# Patient Record
Sex: Male | Born: 1970 | Race: White | Hispanic: No | Marital: Single | Smoking: Never smoker
Health system: Southern US, Community
[De-identification: ages and names within clinical notes are randomized; demographics above are authoritative.]

## PROBLEM LIST (undated history)

## (undated) DIAGNOSIS — I10 Essential (primary) hypertension: Secondary | ICD-10-CM

## (undated) DIAGNOSIS — K219 Gastro-esophageal reflux disease without esophagitis: Secondary | ICD-10-CM

## (undated) HISTORY — PX: EYE SURGERY: SHX253

---

## 2007-03-01 ENCOUNTER — Inpatient Hospital Stay (HOSPITAL_COMMUNITY): Admission: EM | Admit: 2007-03-01 | Discharge: 2007-03-02 | Payer: Self-pay | Admitting: Emergency Medicine

## 2007-03-01 ENCOUNTER — Encounter: Payer: Self-pay | Admitting: Cardiology

## 2007-03-01 ENCOUNTER — Ambulatory Visit: Payer: Self-pay | Admitting: Cardiology

## 2007-03-01 ENCOUNTER — Ambulatory Visit: Payer: Self-pay | Admitting: Cardiovascular Disease

## 2007-03-21 ENCOUNTER — Ambulatory Visit: Payer: Self-pay | Admitting: Cardiovascular Disease

## 2007-03-27 ENCOUNTER — Ambulatory Visit: Payer: Self-pay | Admitting: Cardiovascular Disease

## 2007-06-23 ENCOUNTER — Ambulatory Visit: Payer: Self-pay | Admitting: Cardiovascular Disease

## 2007-09-22 ENCOUNTER — Ambulatory Visit: Payer: Self-pay | Admitting: Cardiovascular Disease

## 2008-01-02 ENCOUNTER — Ambulatory Visit: Payer: Self-pay | Admitting: Cardiovascular Disease

## 2008-03-29 ENCOUNTER — Ambulatory Visit: Payer: Self-pay | Admitting: Cardiovascular Disease

## 2008-06-21 ENCOUNTER — Ambulatory Visit: Payer: Self-pay | Admitting: Cardiovascular Disease

## 2008-09-20 ENCOUNTER — Ambulatory Visit: Payer: Self-pay | Admitting: Cardiovascular Disease

## 2008-12-20 ENCOUNTER — Ambulatory Visit: Payer: Self-pay | Admitting: Cardiovascular Disease

## 2008-12-20 ENCOUNTER — Encounter (INDEPENDENT_AMBULATORY_CARE_PROVIDER_SITE_OTHER): Payer: Self-pay | Admitting: *Deleted

## 2009-03-14 ENCOUNTER — Encounter (INDEPENDENT_AMBULATORY_CARE_PROVIDER_SITE_OTHER): Payer: Self-pay | Admitting: *Deleted

## 2009-03-14 ENCOUNTER — Ambulatory Visit: Payer: Self-pay | Admitting: Cardiology

## 2009-03-14 ENCOUNTER — Ambulatory Visit: Payer: Self-pay | Admitting: Cardiovascular Disease

## 2009-03-14 DIAGNOSIS — E785 Hyperlipidemia, unspecified: Secondary | ICD-10-CM | POA: Insufficient documentation

## 2009-03-14 DIAGNOSIS — I2510 Atherosclerotic heart disease of native coronary artery without angina pectoris: Secondary | ICD-10-CM

## 2009-03-14 LAB — CONVERTED CEMR LAB
BUN: 11 mg/dL (ref 6–23)
Basophils Relative: 0.6 % (ref 0.0–3.0)
CO2: 28 meq/L (ref 19–32)
Calcium: 9.6 mg/dL (ref 8.4–10.5)
Eosinophils Absolute: 0.2 10*3/uL (ref 0.0–0.7)
Eosinophils Relative: 3 % (ref 0.0–5.0)
GFR calc non Af Amer: 113.75 mL/min (ref >60)
Glucose, Bld: 82 mg/dL (ref 70–99)
INR: 1 (ref 0.8–1.0)
Lymphocytes Relative: 41.6 % (ref 12.0–46.0)
Lymphs Abs: 2.1 10*3/uL (ref 0.7–4.0)
Monocytes Relative: 9.5 % (ref 3.0–12.0)
Neutro Abs: 2.3 10*3/uL (ref 1.4–7.7)
Neutrophils Relative %: 45.3 % (ref 43.0–77.0)
Platelets: 203 10*3/uL (ref 150.0–400.0)
Prothrombin Time: 10.3 s (ref 9.1–11.7)
Sodium: 138 meq/L (ref 135–145)
WBC: 5.1 10*3/uL (ref 4.5–10.5)
aPTT: 27.3 s (ref 21.7–28.8)

## 2009-03-21 ENCOUNTER — Ambulatory Visit: Payer: Self-pay | Admitting: Cardiology

## 2009-03-21 ENCOUNTER — Inpatient Hospital Stay (HOSPITAL_BASED_OUTPATIENT_CLINIC_OR_DEPARTMENT_OTHER): Admission: RE | Admit: 2009-03-21 | Discharge: 2009-03-21 | Payer: Self-pay | Admitting: Cardiology

## 2009-03-21 ENCOUNTER — Ambulatory Visit: Payer: Self-pay | Admitting: Cardiovascular Disease

## 2009-03-28 ENCOUNTER — Ambulatory Visit: Payer: Self-pay | Admitting: Cardiovascular Disease

## 2009-04-18 ENCOUNTER — Ambulatory Visit: Payer: Self-pay | Admitting: Cardiology

## 2009-04-18 LAB — CONVERTED CEMR LAB
ALT: 38 units/L (ref 0–53)
AST: 28 units/L (ref 0–37)
Albumin: 4.1 g/dL (ref 3.5–5.2)
Alkaline Phosphatase: 61 units/L (ref 39–117)
Cholesterol: 134 mg/dL (ref 0–200)
VLDL: 14 mg/dL (ref 0.0–40.0)

## 2009-04-22 ENCOUNTER — Ambulatory Visit: Payer: Self-pay | Admitting: Cardiology

## 2010-03-10 NOTE — Miscellaneous (Signed)
Summary: Orders Update  Clinical Lists Changes  Orders: Added new Test order of T-2 View CXR (71020TC) - Signed 

## 2010-03-10 NOTE — Assessment & Plan Note (Signed)
Summary: research pt /pre cath saturn   Visit Type:  Follow-up Primary Provider:  [facility]  CC:  Research Pt. - Pre-cath.  History of Present Illness: The patient is 40 years old and came in for a pre-catheterization evaluation prior to his scheduled procedure. 2 years ago he was admitted with chest pain and underwent catheterization and was found to have nonobstructive coronary disease. At that time he was enrolled in a Saturn trial and underwent intravascular ultrasound of the circumflex artery. He has done well since that time and is now brought back for followup to your catheterization.  He's had no chest pain short of breath or palpitations. He has not been on aspirin because someone told him it might interfere with his kidney function.  His primary care physician is [facility].  He is currently working in a grocery store.  Current Medications (verified): 1)  Saturn Study Drugs .... Take As Directed  Allergies (verified): No Known Drug Allergies  Past History:  Past Medical History: Nonobstructive CAD Hyperlipidemia  Review of Systems       ROS is negative except as outlined in HPI.   Vital Signs:  Patient profile:   40 year old male Height:      72 inches Weight:      224.50 pounds BMI:     30.56 Pulse rate:   52 / minute Pulse rhythm:   irregular Resp:     18 per minute BP sitting:   124 / 100  (left arm) Cuff size:   large  Vitals Entered By: Vikki Ports (March 14, 2009 11:49 AM)  Physical Exam  Additional Exam:  Gen. Well-nourished, in no distress   Neck: No JVD, thyroid not enlarged, no carotid bruits Lungs: No tachypnea, clear without rales, rhonchi or wheezes Cardiovascular: Rhythm regular, PMI not displaced,  heart sounds  normal, no murmurs or gallops, no peripheral edema, pulses normal in all 4 extremities. Abdomen: BS normal, abdomen soft and non-tender without masses or organomegaly, no  hepatosplenomegaly. MS: No deformities, no cyanosis or clubbing   Neuro:  No focal sns   Skin:  no lesions  He had cyanosis of the toes on both feet. This appeared to be more prominent when the feet were dependent. He had good pedal pulses. There were no petechiae.   Impression & Recommendations:  Problem # 1:  CORONARY ATHEROSCLEROSIS NATIVE CORONARY ARTERY (ICD-414.01) He had nonobstructive CAD at catheterization 2 years ago. He has had no symptoms. He is scheduled for a followup catheterization and intravascular ultrasound study in one week.  He also has some dependent cyanosis. He has good peripheral pulses. This may be related to small vessel disease. His updated medication list for this problem includes:    Aspirin 81 Mg Tbec (Aspirin) .Marland Kitchen... Take one tablet by mouth daily  Orders: TLB-BMP (Basic Metabolic Panel-BMET) (80048-METABOL) TLB-CBC Platelet - w/Differential (85025-CBCD) TLB-PTT (85730-PTTL) TLB-PT (Protime) (85610-PTP) T-2 View CXR (71020TC) Cardiac Catheterization (Cardiac Cath)  Problem # 2:  HYPERLIPIDEMIA-MIXED (ICD-272.4) He has hyperlipidemia and is in the [place] trial where he is getting either Lipitor 80 mg or Crestor 40 mg.  Patient Instructions: 1)  Your physician has recommended you make the following change in your medication: 1) Start aspirin 81mg  once daily  2)  Your physician has requested that you have a cardiac catheterization (per research study).Cardiac catheterization is used to diagnose and/or treat various heart conditions. Doctors may recommend this procedure for a number of different reasons. The  most common reason is to evaluate chest pain. Chest pain can be a symptom of coronary artery disease (CAD), and cardiac catheterization can show whether plaque is narrowing or blocking your heart's arteries. This procedure is also used to evaluate the valves, as well as measure the blood flow and oxygen levels in different parts of your heart.  For further  information please visit [email or web address]  Please follow instruction sheet, as given.  Appended Document: research pt /pre cath saturn Per Dr. Juanda Chance- at the end of the pt's research study, he should start Crestor 20mg  once daily. He should have a lipid and liver profile about 3 1/2 weeks after starting Crestor (we will give samples & send to research for them to give to the pt.)

## 2010-03-10 NOTE — Assessment & Plan Note (Signed)
Summary: EPH/JML   Primary Provider:  [facility]  CC:  cardiac catheterization follow up.  Pt states no complaints at this time.  Marland Kitchen  History of Present Illness: Patient is 40 years old and returns for a followup visit after his recent catheterization procedure. 2 years ago he was admitted to the hospital with chest pain and underwent catheterization and was found to have nonobstructive CAD. He was enrolled in a Saturn trial and underwent intravascular ultrasound. Recently had followup catheterization which showed no progression of disease with nonobstructive plaque. He has good LV function.  He is had no symptoms of chest pain shortness of breath or palpitations.  His other problem is hyperlipidemia. He is now off study drug and is taking sample Crestor tablets.  Current Medications (verified): 1)  Aspirin 81 Mg Tbec (Aspirin) .... Take One Tablet By Mouth Daily 2)  Crestor 20 Mg Tabs (Rosuvastatin Calcium) .... Take One Tablet Every Evening  Allergies (verified): No Known Drug Allergies  Past History:  Past Medical History: Reviewed history from 03/14/2009 and no changes required. Nonobstructive CAD Hyperlipidemia  Review of Systems       ROS is negative except as outlined in HPI.   Vital Signs:  Patient profile:   40 year old male Height:      72 inches Weight:      228 pounds BMI:     31.03 Pulse rate:   61 / minute Pulse rhythm:   regular BP sitting:   138 / 90  (left arm) Cuff size:   large  Vitals Entered By: Judithe Modest CMA (April 22, 2009 4:33 PM)  Physical Exam  Additional Exam:  Gen. Well-nourished, in no distress   Neck: No JVD, thyroid not enlarged, no carotid bruits Lungs: No tachypnea, clear without rales, rhonchi or wheezes Cardiovascular: Rhythm regular, PMI not displaced,  heart sounds  normal, no murmurs or gallops, no peripheral edema, pulses normal in all 4 extremities. Abdomen: BS normal, abdomen soft and non-tender without  masses or organomegaly, no hepatosplenomegaly. MS: No deformities, no cyanosis or clubbing   Neuro:  No focal sns   Skin:  no lesions  The right femoral artery site was well-healed   Impression & Recommendations:  Problem # 1:  CORONARY ATHEROSCLEROSIS NATIVE CORONARY ARTERY (ICD-414.01) He has nonobstructive CAD at follow up catheterization. It looks like there has been little or no progression over the past 2 years. We will continue secondary risk factor modification. His updated medication list for this problem includes:    Aspirin 81 Mg Tbec (Aspirin) .Marland Kitchen... Take one tablet by mouth daily  Problem # 2:  HYPERLIPIDEMIA-MIXED (ICD-272.4) He has hyperlipidemia and is currently on Crestor samples. Finances are an issue and I think the most economical way for him to get the cholesterol lowering would be with the Lipitor program we can get a discount from his co-pay. We plan to start him on 40 mg of Lipitor we'll get a followup lipid and liver panel in 6 weeks. He will followup with his primary care physician and we will arrange for cardiology follow up appointment in one year with Dr. Shirlee Latch. His updated medication list for this problem includes:    Lipitor 40 Mg Tabs (Atorvastatin calcium) .Marland Kitchen... Take one tablet by mouth daily.  Patient Instructions: 1)  Your physician has recommended you make the following change in your medication: 1) Start lipitor 40mg  once daily  2)  You will need FASTING labs in 6 weeks after starting lipitor-  this will be due around 05/26/09. 3)  Your physician wants you to follow-up in: 1 year with Dr. Shirlee Latch.  You will receive a reminder letter in the mail two months in advance. If you don't receive a letter, please call our office to schedule the follow-up appointment. Prescriptions: LIPITOR 40 MG TABS (ATORVASTATIN CALCIUM) Take one tablet by mouth daily.  #30 x 6   Entered by:   Sherri Rad, RN, BSN   Authorized by:   Lenoria Farrier, MD, [facility]   Signed by:    Sherri Rad, RN, BSN on 04/22/2009   Method used:   Electronically to        [facility] [phone]* (retail)       [address]       [place], [place]  [address]       Ph: [phone] or [phone]       Fax: [phone]   RxID:   [id number]

## 2010-03-10 NOTE — Cardiovascular Report (Signed)
Summary: Pre Cath Orders  Pre Cath Orders   Imported By: Roderic Ovens 03/19/2009 12:27:20  _____________________________________________________________________  External Attachment:    Type:   Image     Comment:   External Document

## 2010-03-10 NOTE — Letter (Signed)
Summary: Cardiac Catheterization Instructions- [facility]  [facility], Main Office  [address]   [place], [place] [address]   Phone: [phone]  Fax: [phone]     03/14/2009 MRN: [id number]  Rodney Lozano [address] [place], [place]  [address]  Dear [name],   You are scheduled for a Cardiac Catheterization on Friday 03/21/09 with Dr.Brodie  Please arrive to the 1st floor of the Heart and Vascular Center at [facility] at 7:30 am on the day of your procedure. Please do not arrive before 6:30 a.m. Call the Heart and Vascular Center at [phone] if you are unable to make your appointmnet. The Code to get into the parking garage under the building is 0900. Take the elevators to the 1st floor. You must have someone to drive you home. Someone must be with you for the first 24 hours after you arrive home. Please wear clothes that are easy to get on and off and wear slip-on shoes. Do not eat or drink after midnight except water with your medications that morning. Bring all your medications and current insurance cards with you.  ___ DO NOT take these medications before your procedure: ________________________________________________________________  _x__ Make sure you take your aspirin.  _x__ You may take ALL of your medications with water that morning. ________________________________________________________________________________________________________________________________  ___ DO NOT take ANY medications before your procedure.  ___ Pre-med instructions:  ________________________________________________________________________________________________________________________________  The usual length of stay after your procedure is 2 to 3 hours. This can vary.  If you have any questions, please call the office at the number listed above.   Sherri Rad, RN, BSN

## 2010-06-23 NOTE — H&P (Signed)
NAME:  Rodney Lozano, Rodney Lozano NO.:  [id number]   MEDICAL RECORD NO.:  [id number]          PATIENT TYPE:  EMS   LOCATION:  MAJO                         FACILITY:  [facility]   PHYSICIAN:  Vernice Jefferson, MD          DATE OF BIRTH:  Jun 26, 1970   DATE OF ADMISSION:  03/01/2007  DATE OF DISCHARGE:                              HISTORY & PHYSICAL   CHIEF COMPLAINT:  Shortness of breath.   HISTORY OF PRESENT ILLNESS:  The patient is a 40 year old white male  with no past medical history who comes in with complaints of shortness  of breath that began approximately 12 o'clock this evening also  associated with some fluttering in his chest.  The patient reports  that he has not throughout the course of this had any chest pain.  He  has shortness of breath.  It got somewhat better in the EMS vehicle with  nitroglycerin.  Prior to this episode this evening, the patient has been  in very good health.  He has had no complaints.  He has occasional right  arm numbness and tingling and right leg numbness but it has not been  exertional related.  He has never had any chest pain or pressure  associated with exertion.  There is no syncope, no presyncope.  Currently the patient is feeling mildly short of breath.  He denies any  current chest pain or pressure.  The patient also denies any increasing  lower extremity edema, no increased weight gain over the past week.   PAST MEDICAL HISTORY:  None.   SOCIAL HISTORY:  Lives in [place] with mother.  No tobacco.  Weekend  drinking only.  No drug use.   FAMILY HISTORY:  Positive for early coronary artery disease with  grandfather with MI in the 48s and father has had a CABG by age 40.   ALLERGIES:  No known drug allergies.   MEDICATIONS:  None.  The patient reports he does not take any herbal  medications either.   REVIEW OF SYSTEMS:  Negative 11-point review of systems except for what  was dictated in the above HPI.   PHYSICAL EXAMINATION:   VITAL SIGNS:  Blood pressure 139/86, heart rate  112, respirations 12, T-max is afebrile.  GENERAL:  Well-developed, well-nourished white male in no acute  distress.  HEENT:  Moist mucous membranes. Sclerae anicteric. No pallor.  NECK:  Supple.  No jugular venous distention.  CARDIOVASCULAR:  Regular rate and rhythm without murmurs, rubs, or  gallops.  CHEST:  Clear to auscultation bilaterally.  No wheezes, rales or  rhonchi.  ABDOMEN:  Soft, nontender, nondistended.  Normal bowel sounds.  EXTREMITIES:  No peripheral edema.  Pulses 2+ bilaterally.  NEUROLOGIC:  Nonfocal.   MEDICAL DECISION MAKING:  Chest x-ray demonstrates no volume overload,  no infiltrates.  EKG personally interpreted by me demonstrates normal  sinus rhythm with left atrial enlargement, frequent PVCs but no acute ST-  T wave abnormalities.   LABORATORY DATA:  Significant for a white count of 7.3, hemoglobin 15,1,  platelets 286.  BUN and creatinine of 16 and  0.9, glucose 124, D-dimer  negative.  BNP 50 which is negative.  Cardiac biomarkers are negative.   IMPRESSION:  1. Acute dyspnea.  Possible acute coronary syndrome with unstable      angina.  2. Frequent ventricular ectopy.   PLAN:  Given his above D-dimer and BNP are negative and EKG not  suggestive of acute event, as well as there is no evidence of  significant volume overload, I doubt that this shortness of breath is  related to congestive heart failure and less likely it is related to an  acute coronary syndrome.  However, since he does have an early family  history and was hypertensive coming into the emergency department, we  think he probably needs to come in for serial cardiac biomarkers and  possible risk stratification.  His ectopy could be related to scar  related focus versus active ischemia.  Given his low TIMI risk score  however and lack of chest pain, I do not think heparin is indicated at  this time.  He has gotten a full dose of aspirin  and we will follow  these biomarkers.  A 2-D echocardiogram in the morning to evaluate for  any structural heart disease.      Vernice Jefferson, MD  Electronically Signed     JT/MEDQ  D:  03/01/2007  T:  03/01/2007  Job:  [id number]

## 2010-06-23 NOTE — Cardiovascular Report (Signed)
NAME:  Rodney Lozano, Rodney Lozano NO.:  [id number]   MEDICAL RECORD NO.:  [id number]          PATIENT TYPE:  INP   LOCATION:  3730                         FACILITY:  [facility]   PHYSICIAN:  Everardo Beals. Juanda Chance, MD, FACCDATE OF BIRTH:  [birth year]   DATE OF PROCEDURE:  03/01/2007  DATE OF DISCHARGE:  03/02/2007                            CARDIAC CATHETERIZATION   PAST MEDICAL HISTORY:  [name] is 40 years old and was admitted  with the shortness of breath, thought to represent an acute coronary  syndrome.   PROCEDURE:  The procedure was performed at the right femoral artery,  arterial sheath, with 6-French pyriform coronary catheters.  A front  wall arterial puncture was performed, and Omnipaque contrast was used.   The patient was enrolled in the SATURN trial and an IVUS was performed  as part of this trial.  This was performed at the circumflex artery.  The patient was given weight-adjusted heparin upon ACT greater than 200  seconds prior to this.  He tolerated the procedure well and left the  laboratory in satisfactory condition.   RESULTS:  The left main coronary artery.  The left main coronary artery  was found to be free of significant disease.   Left anterior descending artery.  Left anterior descending artery gave  rise to 2 diagonal branches and several septal perforators.  There was  30% proximal and 20% mid stenosis in the LAD.   The circumflex artery.  The circumflex artery gave rise to an atrial  branch, a marginal branch, and 2 posterolateral branches.  There was 30%  proximal, 40% mid, and 30% distal stenosis in the circumflex artery.   The right coronary artery.  The right coronary artery is a moderate-  sized vessel and gave rise to conus branch, 2 right ventricular  branches, and posterior descending branch, and 2 posterolateral  branches.  There was 30% narrowing in the mid right coronary artery.   The left ventriculogram.  The left ventriculogram  performed in the RAO  projection showed good wall motion and no areas of hypokinesis.  The  estimated ejection fraction was 60%.   The distal aortogram.  The distal aortogram was performed which showed  patent renal arteries and no significant aortoiliac obstruction.   Intravascular ultrasound showed a moderate of amount of plaque which was  nonobstructive.   CONCLUSION:  1. Nonobstructive coronary artery disease with 30% proximal and 20%      mid stenosis in the left anterior descending artery; 30% proximal,      40% mid, and 30% distal stenosis in the circumflex artery; 30%      narrowing in the right coronary, and normal LV function.  2. Intravascular ultrasound of the circumflex artery as part of the      SATURN.   DISPOSITION:  The patient will return to post angio room for further  observation.  If the patient qualifies for SATURN, we will obtain a  followup cath in 2 years.  We will plan an intensive check and risk  factor modification.  SATURN randomized in the patient to high dose  Crestor versus 80  mg of atorvastatin.      Bruce Elvera Lennox Juanda Chance, MD, [facility]  Electronically Signed     BRB/MEDQ  D:  07/03/2007  T:  07/03/2007  Job:  [id number]

## 2010-06-26 NOTE — Discharge Summary (Signed)
NAME:  Rodney Lozano, Rodney Lozano NO.:  [id number]   MEDICAL RECORD NO.:  [id number]          PATIENT TYPE:  INP   LOCATION:  3730                         FACILITY:  [facility]   PHYSICIAN:  Noralyn Pick. Eden Emms, MD, FACCDATE OF BIRTH:  [birth year]   DATE OF ADMISSION:  03/01/2007  DATE OF DISCHARGE:  03/02/2007                         DISCHARGE SUMMARY - REFERRING   Please note that the official History and Physical is not on the chart.  According to the paperwork available that is in the chart, Rodney Lozano  is a 40 year old male who presented with shortness of breath that began  at midnight associated with palpitations.  He denied chest discomfort or  syncope.   LABORATORY DATA:  Urine drug screen was unremarkable.  TSH 2.372.  Fasting lipids showed a total cholesterol 239, triglycerides 56, HDL 35,  LDL 193.  CK, MB, relative indexes, and troponins were within normal  limits x2.  BNP was 50. Hemoglobin A1c 5.5.  Admission hemoglobin 15.1,  hematocrit 44.0, normal indices, platelets 286, WBC 7.3. D-dimer 0.33.  Sodium 138, potassium 3.9, BUN 12, creatinine 0.91, glucose 90.   Chest x-ray showed low-volume chest film with vascular crowding and  bibasilar atelectasis.   EKG showed sinus bradycardia, left axis deviation, early R wave,  probable LVH.   HOSPITAL COURSE:  The patient was admitted to the host hospital and  underwent cardiac catheterization on January 21 by Dr. Juanda Chance.  According to his note, he had an EF of 60% three-vessel nonobstructive  coronary artery disease. Dr. Juanda Chance felt that his discomfort with  shortness of breath was not related to cardiac wise; however, he should  be treated for cardiac risk factor prevention.  It was noted that he was  hypertensive on presentation to the emergency room. Overnight he  continued to have symptoms. Dr. Riley Kill was not clear of the etiology.  Echocardiogram was performed by Teodoro Spray. Report is not available to  me at the  time of this dictation.  Dr. Eden Emms had an MRI performed on  the heart, and this showed EF of 52% with abnormal septal motion, no  hyper-enhancement or evidence of myocarditis, infiltration, or scar.  After review, Dr. Eden Emms felt that the patient could be discharged home.  The patient was placed into the SATURN trial by research   DISCHARGE DIAGNOSES:  1. Shortness of breath and palpitations of uncertain etiology.  There      was not any evidence of dysrhythmia based on telemetry in the      hospital.  2. Nonobstructive coronary artery disease .  3. Hypertension .  4. Hyperlipidemia.   PROCEDURES PERFORMED:  Cardiac catheterization by Dr. Juanda Chance on March 01, 2007.   DISPOSITION:  The patient is discharged home on March 02, 2007.  Wound  care and activities were per supplemental sheet.  He received new  medications including:  1. Aspirin 325 mg daily.  2. Lopressor 45 mg b.i.d.  3. Lipitor 40 mg nightly.   He was asked to obtain a primary care physician. The office will call  him with event monitor placement.  He will need blood work  in 6-8 weeks  in regards to FLP and LFTs.  He was asked to bring medications to all  followup appointments with Dr. Juanda Chance which was scheduled for April 14, 2007, at 3:00 p.m.      Joellyn Rued, PA-C      Noralyn Pick. Eden Emms, MD, [facility]  Electronically Signed    EW/MEDQ  D:  05/03/2007  T:  05/03/2007  Job:  [id number]   cc:   Everardo Beals. Juanda Chance, MD, [facility]

## 2010-10-30 LAB — LIPID PANEL
Cholesterol: 239 — ABNORMAL HIGH
HDL: 35 — ABNORMAL LOW
Triglycerides: 56

## 2010-10-30 LAB — TROPONIN I: Troponin I: 0.01

## 2010-10-30 LAB — BASIC METABOLIC PANEL
BUN: 12
CO2: 26
Calcium: 9.3
Chloride: 105
Creatinine, Ser: 0.91
GFR calc Af Amer: >60

## 2010-10-30 LAB — DIFFERENTIAL
Basophils Absolute: 0.1
Lymphocytes Relative: 38
Neutro Abs: 3.3
Neutrophils Relative %: 46

## 2010-10-30 LAB — RAPID URINE DRUG SCREEN, HOSP PERFORMED
Amphetamines: NOT DETECTED
Benzodiazepines: NOT DETECTED
Opiates: NOT DETECTED
Tetrahydrocannabinol: NOT DETECTED

## 2010-10-30 LAB — POCT CARDIAC MARKERS
CKMB, poc: 1.1
Troponin i, poc: <0.05

## 2010-10-30 LAB — CBC
Hemoglobin: 15.1
MCHC: 34.5
MCV: 90.5
Platelets: 263
Platelets: 286
RBC: 4.63
RDW: 13.1
RDW: 13.8

## 2010-10-30 LAB — I-STAT 8, (EC8 V) (CONVERTED LAB)
Acid-base deficit: 1
TCO2: 26
pCO2, Ven: 42.1 — ABNORMAL LOW
pH, Ven: 7.373 — ABNORMAL HIGH

## 2010-10-30 LAB — CK TOTAL AND CKMB (NOT AT ARMC)
CK, MB: 1.6
Relative Index: INVALID

## 2010-10-30 LAB — POCT I-STAT CREATININE
Creatinine, Ser: 0.9
Operator id: 198171

## 2010-10-30 LAB — HEMOGLOBIN A1C: Mean Plasma Glucose: 119

## 2010-10-30 LAB — B-NATRIURETIC PEPTIDE (CONVERTED LAB): Pro B Natriuretic peptide (BNP): 50

## 2010-10-30 LAB — D-DIMER, QUANTITATIVE: D-Dimer, Quant: 0.33

## 2010-10-30 LAB — CARDIAC PANEL(CRET KIN+CKTOT+MB+TROPI): CK, MB: 1.4

## 2010-10-30 LAB — TSH: TSH: 2.372

## 2012-01-12 IMAGING — CR DG CHEST 2V
2 series · 2 of 2 positions shown · non-contrast
Comparison: 03/01/2007

CLINICAL DATA: Pre catheterization evaluation.

CHEST - 2 VIEW

[view not recorded (1 of 2)]
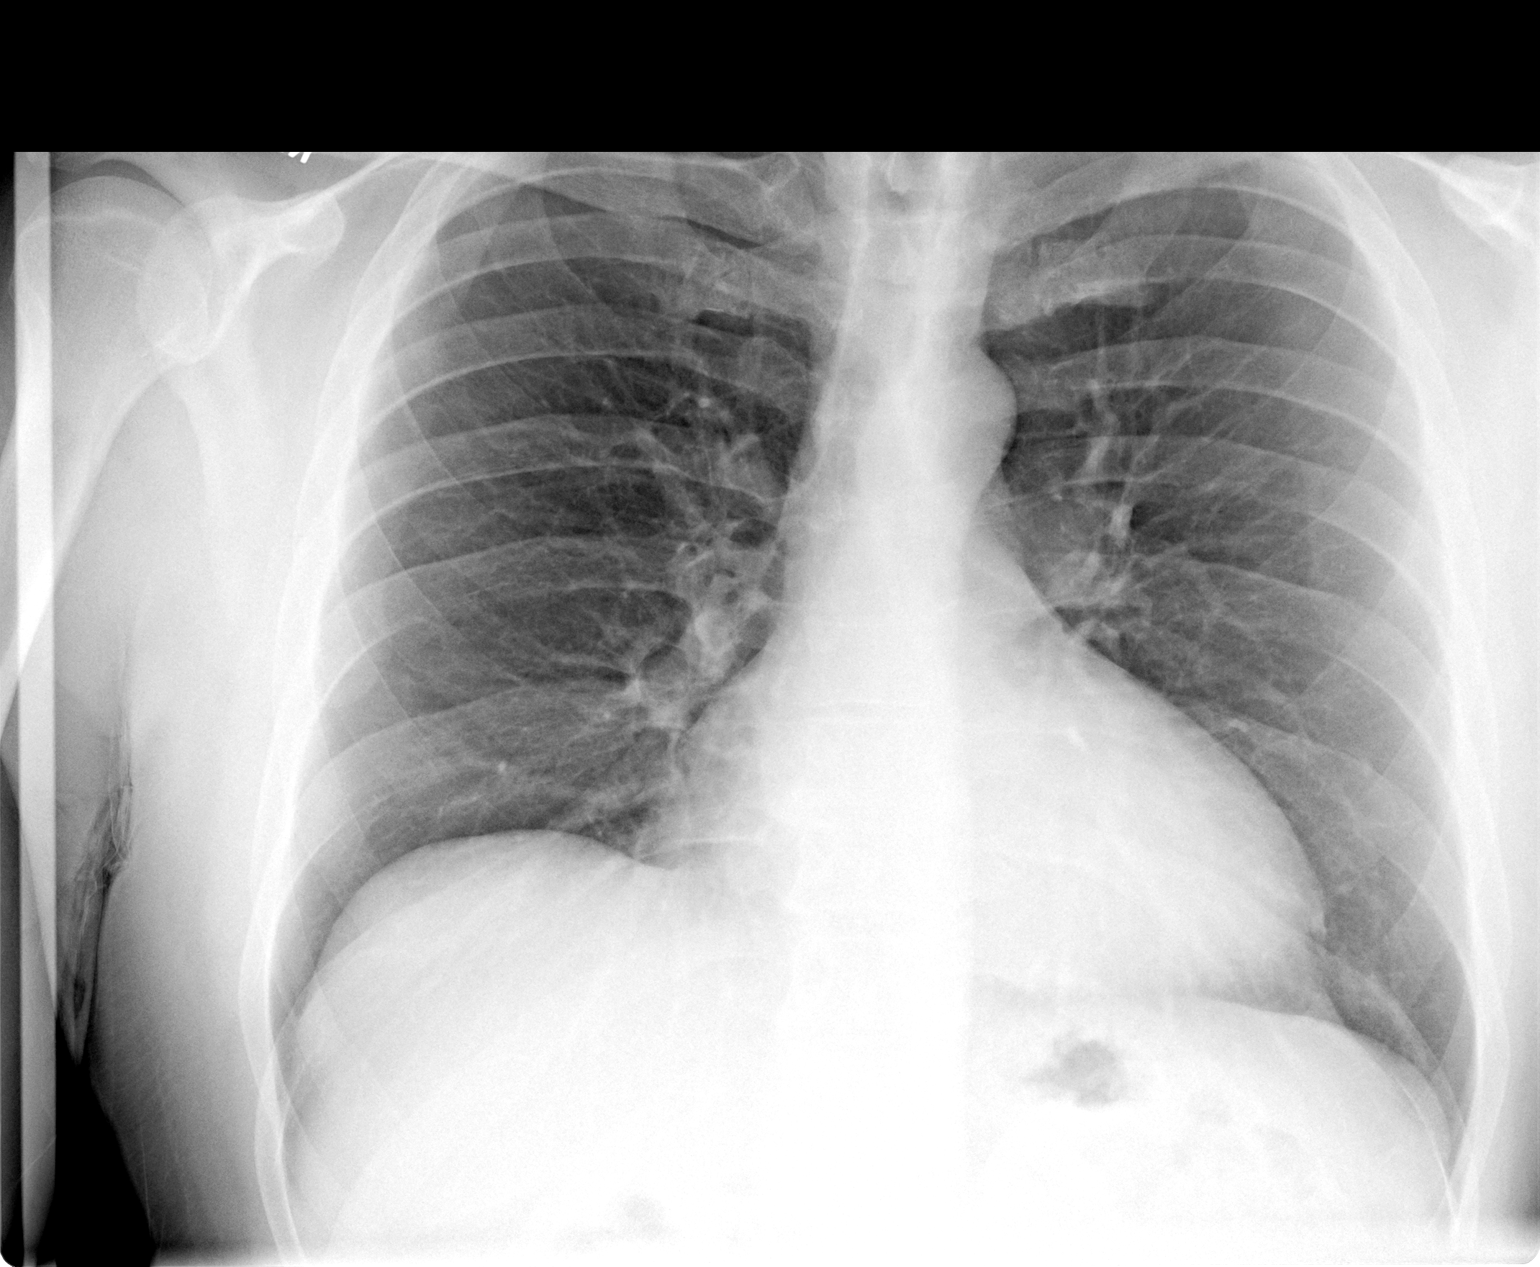

[view not recorded (2 of 2)]
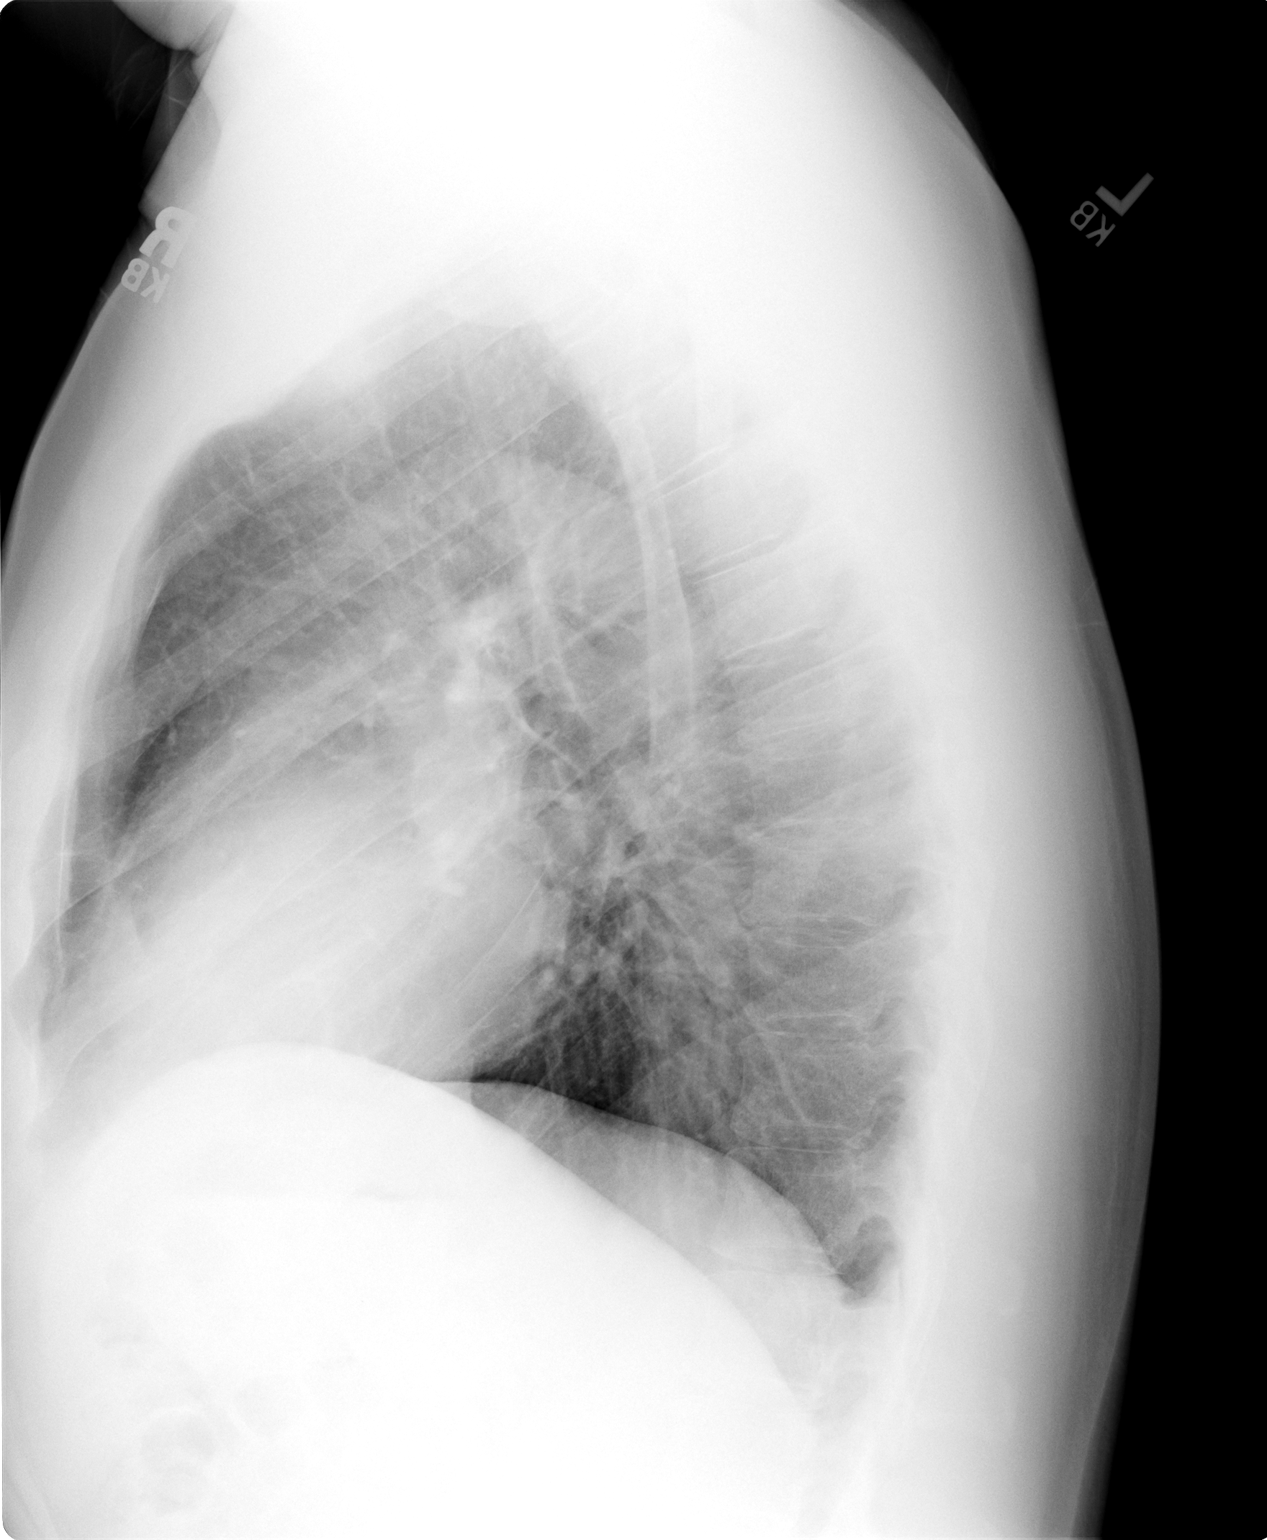

[2 of 2 positions shown; findings below may reference images not displayed]

FINDINGS: Low lung volumes are present and taking this into
consideration heart and mediastinal contours are within normal
limits.

There is some crowding of bronchovascular markings at both bases
due to the low lung volumes.  No focal infiltrates or signs of
congestive failure are noted.  No pleural fluid is seen.  No
peribronchial cuffing is seen.  Bony structures are intact.
IMPRESSION: Stable cardiopulmonary appearance with no acute abnormality noted.

## 2018-09-06 ENCOUNTER — Ambulatory Visit (HOSPITAL_COMMUNITY)
Admission: AD | Admit: 2018-09-06 | Discharge: 2018-09-06 | Disposition: A | Discharge status: Home / Self Care | Payer: BC Managed Care – PPO | Source: Ambulatory Visit | Attending: Ophthalmology | Admitting: Ophthalmology

## 2018-09-06 ENCOUNTER — Ambulatory Visit (HOSPITAL_COMMUNITY): Payer: BC Managed Care – PPO | Admitting: Certified Registered Nurse Anesthetist

## 2018-09-06 ENCOUNTER — Other Ambulatory Visit: Payer: Self-pay | Admitting: Ophthalmology

## 2018-09-06 ENCOUNTER — Encounter (HOSPITAL_COMMUNITY): Payer: Self-pay | Admitting: *Deleted

## 2018-09-06 ENCOUNTER — Other Ambulatory Visit: Payer: Self-pay

## 2018-09-06 ENCOUNTER — Encounter (HOSPITAL_COMMUNITY)
Admission: AD | Disposition: A | Discharge status: Home / Self Care | Payer: Self-pay | Source: Ambulatory Visit | Attending: Ophthalmology

## 2018-09-06 DIAGNOSIS — H5989 Other postprocedural complications and disorders of eye and adnexa, not elsewhere classified: Secondary | ICD-10-CM | POA: Diagnosis not present

## 2018-09-06 DIAGNOSIS — Z20828 Contact with and (suspected) exposure to other viral communicable diseases: Secondary | ICD-10-CM | POA: Diagnosis not present

## 2018-09-06 DIAGNOSIS — Y848 Other medical procedures as the cause of abnormal reaction of the patient, or of later complication, without mention of misadventure at the time of the procedure: Secondary | ICD-10-CM | POA: Insufficient documentation

## 2018-09-06 DIAGNOSIS — I10 Essential (primary) hypertension: Secondary | ICD-10-CM | POA: Insufficient documentation

## 2018-09-06 DIAGNOSIS — H43311 Vitreous membranes and strands, right eye: Secondary | ICD-10-CM | POA: Diagnosis not present

## 2018-09-06 DIAGNOSIS — K219 Gastro-esophageal reflux disease without esophagitis: Secondary | ICD-10-CM | POA: Diagnosis not present

## 2018-09-06 DIAGNOSIS — H44001 Unspecified purulent endophthalmitis, right eye: Secondary | ICD-10-CM | POA: Insufficient documentation

## 2018-09-06 DIAGNOSIS — Z9889 Other specified postprocedural states: Secondary | ICD-10-CM | POA: Diagnosis not present

## 2018-09-06 HISTORY — PX: PARS PLANA VITRECTOMY: SHX2166

## 2018-09-06 HISTORY — DX: Essential (primary) hypertension: I10

## 2018-09-06 HISTORY — DX: Gastro-esophageal reflux disease without esophagitis: K21.9

## 2018-09-06 LAB — SARS CORONAVIRUS 2 BY RT PCR (HOSPITAL ORDER, PERFORMED IN ~~LOC~~ HOSPITAL LAB): SARS Coronavirus 2: NEGATIVE

## 2018-09-06 SURGERY — PARS PLANA VITRECTOMY WITH 25 GAUGE
Anesthesia: General | Site: Eye | Laterality: Right

## 2018-09-06 MED ORDER — BSS IO SOLN
INTRAOCULAR | Status: DC | PRN
  Administered 2018-09-06: 15 mL via INTRAOCULAR

## 2018-09-06 MED ORDER — DEXAMETHASONE SODIUM PHOSPHATE 10 MG/ML IJ SOLN
INTRAMUSCULAR | Status: AC
  Filled 2018-09-06: qty 1

## 2018-09-06 MED ORDER — GATIFLOXACIN 0.5 % OP SOLN
OPHTHALMIC | Status: AC
  Administered 2018-09-06: 1 [drp] via OPHTHALMIC
  Filled 2018-09-06: qty 2.5

## 2018-09-06 MED ORDER — CYCLOPENTOLATE HCL 1 % OP SOLN
OPHTHALMIC | Status: AC
  Administered 2018-09-06: 1 [drp] via OPHTHALMIC
  Filled 2018-09-06: qty 2

## 2018-09-06 MED ORDER — PHENYLEPHRINE HCL 2.5 % OP SOLN
1.0000 [drp] | OPHTHALMIC | Status: AC | PRN
  Administered 2018-09-06 (×3): 1 [drp] via OPHTHALMIC

## 2018-09-06 MED ORDER — PROMETHAZINE HCL 25 MG/ML IJ SOLN: 6.2500 mg | INTRAMUSCULAR | Status: DC | PRN

## 2018-09-06 MED ORDER — HYPROMELLOSE (GONIOSCOPIC) 2.5 % OP SOLN
OPHTHALMIC | Status: AC
  Filled 2018-09-06: qty 15

## 2018-09-06 MED ORDER — VANCOMYCIN INTRAVITREAL INJECTION 1 MG/0.1 ML
INTRAOCULAR | Status: DC | PRN
  Administered 2018-09-06: 1 mg via INTRAVITREAL

## 2018-09-06 MED ORDER — MIDAZOLAM HCL 2 MG/2ML IJ SOLN
INTRAMUSCULAR | Status: AC
  Filled 2018-09-06: qty 2

## 2018-09-06 MED ORDER — LIDOCAINE HCL 2 % IJ SOLN
INTRAMUSCULAR | Status: DC | PRN
  Administered 2018-09-06: 10 mL

## 2018-09-06 MED ORDER — CEFTAZIDIME INTRAVITREAL INJECTION 2.25 MG/0.1 ML
INTRAVITREAL | Status: DC | PRN
  Administered 2018-09-06: 2.25 mg via INTRAVITREAL

## 2018-09-06 MED ORDER — ONDANSETRON HCL 4 MG/2ML IJ SOLN
INTRAMUSCULAR | Status: AC
  Filled 2018-09-06: qty 2

## 2018-09-06 MED ORDER — EPINEPHRINE PF 1 MG/ML IJ SOLN
INTRAMUSCULAR | Status: AC
  Filled 2018-09-06: qty 1

## 2018-09-06 MED ORDER — ACETAMINOPHEN 10 MG/ML IV SOLN: 1000.0000 mg | Freq: Once | INTRAVENOUS | Status: DC | PRN

## 2018-09-06 MED ORDER — LIDOCAINE HCL 2 % IJ SOLN
INTRAMUSCULAR | Status: AC
  Filled 2018-09-06: qty 20

## 2018-09-06 MED ORDER — SODIUM CHLORIDE 0.9 % IV SOLN
1.0000 g | Freq: Once | INTRAVENOUS | Status: DC
  Filled 2018-09-06 (×2): qty 1

## 2018-09-06 MED ORDER — PROPOFOL 10 MG/ML IV BOLUS
INTRAVENOUS | Status: DC | PRN
  Administered 2018-09-06: 150 mg via INTRAVENOUS

## 2018-09-06 MED ORDER — HYDROMORPHONE HCL 1 MG/ML IJ SOLN
INTRAMUSCULAR | Status: AC
  Filled 2018-09-06: qty 1

## 2018-09-06 MED ORDER — BSS PLUS IO SOLN
INTRAOCULAR | Status: AC
  Filled 2018-09-06: qty 500

## 2018-09-06 MED ORDER — TETRACAINE HCL 0.5 % OP SOLN
OPHTHALMIC | Status: AC
  Filled 2018-09-06: qty 4

## 2018-09-06 MED ORDER — SUFENTANIL CITRATE 50 MCG/ML IV SOLN
INTRAVENOUS | Status: DC | PRN
  Administered 2018-09-06: 10 ug via INTRAVENOUS

## 2018-09-06 MED ORDER — GATIFLOXACIN 0.5 % OP SOLN
1.0000 [drp] | OPHTHALMIC | Status: AC | PRN
  Administered 2018-09-06 (×3): 1 [drp] via OPHTHALMIC

## 2018-09-06 MED ORDER — LIDOCAINE 2% (20 MG/ML) 5 ML SYRINGE
INTRAMUSCULAR | Status: AC
  Filled 2018-09-06: qty 5

## 2018-09-06 MED ORDER — SODIUM HYALURONATE 10 MG/ML IO SOLN
INTRAOCULAR | Status: AC
  Filled 2018-09-06: qty 0.85

## 2018-09-06 MED ORDER — SUCCINYLCHOLINE CHLORIDE 200 MG/10ML IV SOSY
PREFILLED_SYRINGE | INTRAVENOUS | Status: AC
  Filled 2018-09-06: qty 10

## 2018-09-06 MED ORDER — VANCOMYCIN HCL IN DEXTROSE 1-5 GM/200ML-% IV SOLN
1000.0000 mg | Freq: Once | INTRAVENOUS | Status: AC
  Administered 2018-09-06: 1000 mg via INTRAVENOUS
  Filled 2018-09-06: qty 200

## 2018-09-06 MED ORDER — HYDRALAZINE HCL 20 MG/ML IJ SOLN: 10.0000 mg | Freq: Four times a day (QID) | INTRAMUSCULAR | Status: DC | PRN

## 2018-09-06 MED ORDER — STERILE WATER FOR INJECTION IJ SOLN
INTRAMUSCULAR | Status: DC | PRN
  Administered 2018-09-06: 50 mL

## 2018-09-06 MED ORDER — SODIUM CHLORIDE 0.9 % IV SOLN
INTRAVENOUS | Status: DC
  Administered 2018-09-06: 18:00:00 via INTRAVENOUS

## 2018-09-06 MED ORDER — ACETAMINOPHEN 325 MG PO TABS
ORAL_TABLET | ORAL | Status: AC
  Filled 2018-09-06: qty 2

## 2018-09-06 MED ORDER — CEFTAZIDIME INTRAVITREAL INJECTION 2.25 MG/0.1 ML
2.2500 mg | INTRAVITREAL | Status: AC
  Administered 2018-09-06: 2.25 mg via INTRAVITREAL
  Filled 2018-09-06: qty 0

## 2018-09-06 MED ORDER — NA CHONDROIT SULF-NA HYALURON 40-30 MG/ML IO SOLN
INTRAOCULAR | Status: AC
  Filled 2018-09-06: qty 0.5

## 2018-09-06 MED ORDER — MIDAZOLAM HCL 2 MG/2ML IJ SOLN
INTRAMUSCULAR | Status: DC | PRN
  Administered 2018-09-06: 2 mg via INTRAVENOUS

## 2018-09-06 MED ORDER — CYCLOPENTOLATE HCL 1 % OP SOLN
1.0000 [drp] | OPHTHALMIC | Status: AC | PRN
  Administered 2018-09-06 (×3): 1 [drp] via OPHTHALMIC

## 2018-09-06 MED ORDER — ACETAMINOPHEN 160 MG/5ML PO SOLN: 325.0000 mg | Freq: Once | ORAL | Status: AC | PRN

## 2018-09-06 MED ORDER — GLYCOPYRROLATE 0.2 MG/ML IJ SOLN
INTRAMUSCULAR | Status: DC | PRN
  Administered 2018-09-06: 0.2 mg via INTRAVENOUS

## 2018-09-06 MED ORDER — VANCOMYCIN INTRAVITREAL INJECTION 1 MG/0.1 ML
1.0000 mg | INTRAOCULAR | Status: DC
  Filled 2018-09-06: qty 0.1

## 2018-09-06 MED ORDER — ONDANSETRON HCL 4 MG/2ML IJ SOLN
INTRAMUSCULAR | Status: DC | PRN
  Administered 2018-09-06: 4 mg via INTRAVENOUS

## 2018-09-06 MED ORDER — BSS PLUS IO SOLN
INTRAOCULAR | Status: DC | PRN
  Administered 2018-09-06: 1 via INTRAOCULAR

## 2018-09-06 MED ORDER — HYDROMORPHONE HCL 1 MG/ML IJ SOLN
0.2500 mg | INTRAMUSCULAR | Status: DC | PRN
  Administered 2018-09-06 (×2): 0.5 mg via INTRAVENOUS

## 2018-09-06 MED ORDER — SUCCINYLCHOLINE CHLORIDE 200 MG/10ML IV SOSY
PREFILLED_SYRINGE | INTRAVENOUS | Status: DC | PRN
  Administered 2018-09-06: 140 mg via INTRAVENOUS

## 2018-09-06 MED ORDER — LACTATED RINGERS IV SOLN
INTRAVENOUS | Status: DC | PRN
  Administered 2018-09-06: 20:00:00 via INTRAVENOUS

## 2018-09-06 MED ORDER — HYDRALAZINE HCL 20 MG/ML IJ SOLN
INTRAMUSCULAR | Status: AC
  Filled 2018-09-06: qty 1

## 2018-09-06 MED ORDER — LIDOCAINE 2% (20 MG/ML) 5 ML SYRINGE
INTRAMUSCULAR | Status: DC | PRN
  Administered 2018-09-06: 100 mg via INTRAVENOUS

## 2018-09-06 MED ORDER — PROPOFOL 10 MG/ML IV BOLUS
INTRAVENOUS | Status: AC
  Filled 2018-09-06: qty 20

## 2018-09-06 MED ORDER — EPINEPHRINE PF 1 MG/ML IJ SOLN
INTRAMUSCULAR | Status: DC | PRN
  Administered 2018-09-06: 1 mg

## 2018-09-06 MED ORDER — MEPERIDINE HCL 25 MG/ML IJ SOLN: 6.2500 mg | INTRAMUSCULAR | Status: DC | PRN

## 2018-09-06 MED ORDER — DEXAMETHASONE SODIUM PHOSPHATE 10 MG/ML IJ SOLN
INTRAMUSCULAR | Status: DC | PRN
  Administered 2018-09-06: 10 mg via INTRAVENOUS

## 2018-09-06 MED ORDER — LACTATED RINGERS IV SOLN: INTRAVENOUS | Status: DC

## 2018-09-06 MED ORDER — PHENYLEPHRINE HCL 2.5 % OP SOLN
OPHTHALMIC | Status: AC
  Administered 2018-09-06: 19:00:00 1 [drp] via OPHTHALMIC
  Filled 2018-09-06: qty 2

## 2018-09-06 MED ORDER — SUFENTANIL CITRATE 50 MCG/ML IV SOLN
INTRAVENOUS | Status: AC
  Filled 2018-09-06: qty 1

## 2018-09-06 MED ORDER — SODIUM CHLORIDE (PF) 0.9 % IJ SOLN
INTRAMUSCULAR | Status: AC
  Filled 2018-09-06: qty 10

## 2018-09-06 MED ORDER — GLYCOPYRROLATE PF 0.2 MG/ML IJ SOSY
PREFILLED_SYRINGE | INTRAMUSCULAR | Status: AC
  Filled 2018-09-06: qty 1

## 2018-09-06 MED ORDER — ACETAMINOPHEN 325 MG PO TABS
325.0000 mg | ORAL_TABLET | Freq: Once | ORAL | Status: AC | PRN
  Administered 2018-09-06: 650 mg via ORAL

## 2018-09-06 SURGICAL SUPPLY — 56 items
APPLICATOR COTTON TIP 6 STRL (MISCELLANEOUS) ×1 IMPLANT
APPLICATOR COTTON TIP 6IN STRL (MISCELLANEOUS) ×3
APPLICATOR DR MATTHEWS STRL (MISCELLANEOUS) IMPLANT
BLADE MVR KNIFE 20G (BLADE) IMPLANT
BNDG EYE OVAL (GAUZE/BANDAGES/DRESSINGS) ×3 IMPLANT
CANNULA ANT CHAM MAIN (OPHTHALMIC RELATED) IMPLANT
CANNULA VLV SOFT TIP 25GA (OPHTHALMIC) ×3 IMPLANT
CORD BIPOLAR FORCEPS 12FT (ELECTRODE) IMPLANT
COVER MAYO STAND STRL (DRAPES) IMPLANT
COVER WAND RF STERILE (DRAPES) ×3 IMPLANT
DRAPE INCISE 51X51 W/FILM STRL (DRAPES) IMPLANT
DRAPE OPHTHALMIC 77X100 STRL (CUSTOM PROCEDURE TRAY) ×3 IMPLANT
FILTER BLUE MILLIPORE (MISCELLANEOUS) IMPLANT
FORCEPS ECKARDT ILM 25G SERR (OPHTHALMIC RELATED) IMPLANT
FORCEPS GRIESHABER ILM 25G A (INSTRUMENTS) IMPLANT
FORCEPS HORIZONTAL 25G DISP (OPHTHALMIC RELATED) IMPLANT
GAS AUTO FILL CONSTEL (OPHTHALMIC)
GAS AUTO FILL CONSTELLATION (OPHTHALMIC) IMPLANT
GLOVE SS BIOGEL STRL SZ 8.5 (GLOVE) ×1 IMPLANT
GLOVE SUPERSENSE BIOGEL SZ 8.5 (GLOVE) ×2
GOWN STRL REUS W/ TWL LRG LVL3 (GOWN DISPOSABLE) ×1 IMPLANT
GOWN STRL REUS W/ TWL XL LVL3 (GOWN DISPOSABLE) ×1 IMPLANT
GOWN STRL REUS W/TWL LRG LVL3 (GOWN DISPOSABLE) ×2
GOWN STRL REUS W/TWL XL LVL3 (GOWN DISPOSABLE) ×2
KIT BASIN OR (CUSTOM PROCEDURE TRAY) ×3 IMPLANT
KNIFE CRESCENT 2.5 55 ANG (BLADE) IMPLANT
LENS BIOM SUPER VIEW SET DISP (MISCELLANEOUS) ×3 IMPLANT
MICROPICK 25G (MISCELLANEOUS)
NEEDLE 18GX1X1/2 (RX/OR ONLY) (NEEDLE) IMPLANT
NEEDLE 25GX 5/8IN NON SAFETY (NEEDLE) IMPLANT
NEEDLE FILTER BLUNT 18X 1/2SAF (NEEDLE)
NEEDLE FILTER BLUNT 18X1 1/2 (NEEDLE) IMPLANT
NEEDLE HYPO 25GX1X1/2 BEV (NEEDLE) IMPLANT
NS IRRIG 1000ML POUR BTL (IV SOLUTION) ×3 IMPLANT
PACK FRAGMATOME (OPHTHALMIC) IMPLANT
PACK VITRECTOMY CUSTOM (CUSTOM PROCEDURE TRAY) ×3 IMPLANT
PAD ARMBOARD 7.5X6 YLW CONV (MISCELLANEOUS) ×6 IMPLANT
PAK PIK VITRECTOMY CVS 25GA (OPHTHALMIC) ×3 IMPLANT
PENCIL BIPOLAR 25GA STR DISP (OPHTHALMIC RELATED) IMPLANT
PICK MICROPICK 25G (MISCELLANEOUS) IMPLANT
PROBE LASER ILLUM FLEX CVD 25G (OPHTHALMIC) IMPLANT
ROLLS DENTAL (MISCELLANEOUS) IMPLANT
SCRAPER DIAMOND 25GA (OPHTHALMIC RELATED) IMPLANT
SET INJECTOR OIL FLUID CONSTEL (OPHTHALMIC) IMPLANT
STOCKINETTE IMPERVIOUS 9X36 MD (GAUZE/BANDAGES/DRESSINGS) ×6 IMPLANT
STOPCOCK 4 WAY LG BORE MALE ST (IV SETS) IMPLANT
SUT ETHILON 10 0 CS140 6 (SUTURE) IMPLANT
SUT ETHILON 8 0 BV130 4 (SUTURE) IMPLANT
SUT MERSILENE 5 0 RD 1 DA (SUTURE) IMPLANT
SUT PROLENE 10 0 CIF 4 DA (SUTURE) IMPLANT
SUT VICRYL 7 0 TG140 8 (SUTURE) IMPLANT
SYR 30ML SLIP (SYRINGE) IMPLANT
SYR 5ML LL (SYRINGE) IMPLANT
SYR TB 1ML LUER SLIP (SYRINGE) IMPLANT
WATER STERILE IRR 1000ML POUR (IV SOLUTION) ×3 IMPLANT
WIPE INSTRUMENT VISIWIPE 73X73 (MISCELLANEOUS) IMPLANT

## 2018-09-06 NOTE — Anesthesia Procedure Notes (Signed)
Procedure Name: Intubation Date/Time: 09/06/2018 7:27 PM Performed by: Claris Che, CRNA Pre-anesthesia Checklist: Patient identified, Emergency Drugs available, Suction available, Patient being monitored and Timeout performed Patient Re-evaluated:Patient Re-evaluated prior to induction Oxygen Delivery Method: Circle system utilized Preoxygenation: Pre-oxygenation with 100% oxygen Induction Type: IV induction, Rapid sequence and Cricoid Pressure applied Ventilation: Mask ventilation without difficulty Laryngoscope Size: Mac and 4 Grade View: Grade III Tube type: Oral Tube size: 7.5 mm Number of attempts: 2 Airway Equipment and Method: Stylet Placement Confirmation: ETT inserted through vocal cords under direct vision,  positive ETCO2 and breath sounds checked- equal and bilateral Secured at: 24 cm Tube secured with: Tape Dental Injury: Teeth and Oropharynx as per pre-operative assessment

## 2018-09-06 NOTE — H&P (Signed)
Patient is 7 days status post vitrectomy and removal of silicone oil right eye after successful repair of complex retinal detachment some  months previous.  Patient was seen in the office today and was noted to have a decrease in vision with haziness throughout.  On questioning patient reports having some mild to moderate discomfort some 4 days previously which was relieved with Advil and Tylenol.  Patient noticed a decline in vision but waited to follow-up today in the office as scheduled.  Examination discloses hand motion vision.  No view of the posterior pole was a possible funduscopically however there was diffuse vitreous strands membranes white cells are throughout the posterior vitreous cavity.  B-scan ultrasonography confirmed the presence of vitreous membranes and strands and suspected endophthalmitis.  The retina appeared attached.  Impression #1 acute endophthalmitis status post vitrectomy within the last 7 days.  Plan is surgical intervention via vitrectomy, vitreous tap and culture of the vitreous contents, injection of intravitreal antibiotics of the right eye tonight.  Patient understands the potential outcomes are based basically upon the type of organism.  Patient ate at 12:00 earlier today some 7/2 hours before.  At 715 upon this dictation we will continue close emergencies that we can proceed with surgical intervention under general anesthesia.  The wait for this time.  Has been due to COVID testing.

## 2018-09-06 NOTE — Op Note (Signed)
[id number] confirm dictation number

## 2018-09-06 NOTE — Brief Op Note (Signed)
Preoperative diagnosis  1.  Acute endophthalmitis right eye  2.  Status post recent vitrectomy and silicone oil removal, 7 days previous  Postoperative diagnoses 1 and 2 of the same  Procedure  1.  Posterior vitrectomy right eye  2.  Vitreous washing sent for culture, vitreous tap right 9  3.  Injection intravitreal antibiotics, Fortaz  2.25 mg /  0.1 cc, and vancomycin 1.0 mg/ 0.1 cc    Surgeon Deloria Lair, MD  Anesthesia General endotracheal anesthesia  Indication for procedure patient is recent vision loss and mild discomfort in the right eye some 3 to 4 days post recent surgery on the right eye.  Patient now presents 7 days post surgery reporting these complaints

## 2018-09-06 NOTE — Progress Notes (Signed)
This nurse is taking over this patients care at 2113.

## 2018-09-06 NOTE — Transfer of Care (Signed)
Immediate Anesthesia Transfer of Care Note  Patient: Rodney Lozano  Procedure(s) Performed: PARS PLANA VITRECTOMY WITH 25 GAUGE with injection of antibiotics - Endophthalmitis protocol (Right Eye)  Patient Location: PACU  Anesthesia Type:General  Level of Consciousness: awake, alert , oriented, drowsy and patient cooperative  Airway & Oxygen Therapy: Patient Spontanous Breathing and Patient connected to nasal cannula oxygen  Post-op Assessment: Report given to RN, Post -op Vital signs reviewed and stable and Patient moving all extremities X 4  Post vital signs: Reviewed and stable  Last Vitals:  Vitals Value Taken Time  BP 175/107 09/06/18 2037  Temp 36.3 C 09/06/18 2039  Pulse 77 09/06/18 2045  Resp 20 09/06/18 2045  SpO2 97 % 09/06/18 2045  Vitals shown include unvalidated device data.  Last Pain:  Vitals:   09/06/18 2035  TempSrc:   PainSc: Asleep      Patients Stated Pain Goal: 0 (54/56/25 6389)  Complications: No apparent anesthesia complications

## 2018-09-06 NOTE — Anesthesia Postprocedure Evaluation (Signed)
Anesthesia Post Note  Patient: Rodney Lozano  Procedure(s) Performed: PARS PLANA VITRECTOMY WITH 25 GAUGE with injection of antibiotics - Endophthalmitis protocol (Right Eye)     Patient location during evaluation: PACU Anesthesia Type: General Level of consciousness: awake and alert Pain management: pain level controlled Vital Signs Assessment: post-procedure vital signs reviewed and stable Respiratory status: spontaneous breathing, nonlabored ventilation, respiratory function stable and patient connected to nasal cannula oxygen Cardiovascular status: blood pressure returned to baseline and stable Postop Assessment: no apparent nausea or vomiting Anesthetic complications: no    Last Vitals:  Vitals:   09/06/18 2115 09/06/18 2130  BP: (!) 164/99 (!) 162/97  Pulse: 72 72  Resp: 17 19  Temp:  (!) 36.1 C  SpO2: 94% 92%                   Effie Berkshire

## 2018-09-06 NOTE — Anesthesia Preprocedure Evaluation (Addendum)
Anesthesia Evaluation  Patient identified by MRN, date of birth, ID band Patient awake    Reviewed: Allergy & Precautions, NPO status , Patient's Chart, lab work & pertinent test results  Airway Mallampati: III  TM Distance: <3 FB   Mouth opening: Limited Mouth Opening  Dental  (+) Teeth Intact, Dental Advisory Given   Pulmonary neg pulmonary ROS,    breath sounds clear to auscultation       Cardiovascular hypertension, Pt. on medications  Rhythm:Regular Rate:Normal     Neuro/Psych negative neurological ROS  negative psych ROS   GI/Hepatic Neg liver ROS, GERD  ,  Endo/Other  negative endocrine ROS  Renal/GU negative Renal ROS     Musculoskeletal negative musculoskeletal ROS (+)   Abdominal Normal abdominal exam  (+)   Peds  Hematology negative hematology ROS (+)   Anesthesia Other Findings   Reproductive/Obstetrics                             Lab Results  Component Value Date   WBC 5.1 03/14/2009   HGB 16.3 03/14/2009   HCT 49.6 03/14/2009   MCV 95.6 03/14/2009   PLT 203.0 03/14/2009   Lab Results  Component Value Date   CREATININE 0.8 03/14/2009   BUN 11 03/14/2009   NA 138 03/14/2009   K 4.3 03/14/2009   CL 102 03/14/2009   CO2 28 03/14/2009   EKG: normal sinus rhythm.  Anesthesia Physical Anesthesia Plan  ASA: II and emergent  Anesthesia Plan: General   Post-op Pain Management:    Induction: Intravenous, Rapid sequence and Cricoid pressure planned  PONV Risk Score and Plan: 3 and Ondansetron, Dexamethasone and Midazolam  Airway Management Planned: Oral ETT  Additional Equipment: None  Intra-op Plan:   Post-operative Plan: Extubation in OR  Informed Consent: I have reviewed the patients History and Physical, chart, labs and discussed the procedure including the risks, benefits and alternatives for the proposed anesthesia with the patient or authorized  representative who has indicated his/her understanding and acceptance.     Dental advisory given  Plan Discussed with: CRNA  Anesthesia Plan Comments: (Possible Glidescope  COVID-19 Labs  No results for input(s): DDIMER, FERRITIN, LDH, CRP in the last 72 hours.  Lab Results      Component                Value               Date                      SARSCOV2NAA              NEGATIVE            09/06/2018            )       Anesthesia Quick Evaluation

## 2018-09-07 ENCOUNTER — Encounter (HOSPITAL_COMMUNITY): Payer: Self-pay | Admitting: Ophthalmology

## 2018-09-07 NOTE — Op Note (Signed)
NAME: ROLAN, WRIGHTSMAN MEDICAL RECORD XT:[id number] ACCOUNT [id number] DATE OF BIRTH:July 20, 1970 FACILITY: [facility] LOCATION: [facility] PHYSICIAN:Darria Corvera Dorina Hoyer, MD  OPERATIVE REPORT  DATE OF PROCEDURE:  09/06/2018  PREOPERATIVE DIAGNOSES: 1.  Acute endophthalmitis, right eye. 2.  Status post recent vitrectomy and silicone oil removal, right eye, for complex retinal detachment repair some months prior.  POSTOPERATIVE DIAGNOSES: 1.  Acute endophthalmitis, right eye. 2.  Status post recent vitrectomy and silicone oil removal, right eye, for complex retinal detachment repair some months prior.  PROCEDURE PERFORMED: 1.  Posterior vitrectomy and removal of vitreous membranes and strands. 2.  Vitreous tap, vitreous washings sent for culture and Gram stain. 3.  Injection of intravitreal antibiotics, Fortaz 2.25 mg/0.1 mL as well as vancomycin 1.0 mg/0.1 mL.  SURGEON:  Deloria Lair, MD  ANESTHESIA:  General endotracheal anesthesia.  INDICATIONS:  The patient is some 7 days status post recent vitrectomy and silicone oil removal after successful retinal detachment repair.  The patient reported today in the office in routine followup that he has had some decline and haziness in vision,  worse over the last 2 days, but developed some 4 days previously over the weekend accompanied by some mild discomfort.  His discomfort had been relieved with Tylenol and ibuprofen and thus he did not seek or call for medical attention.  Findings today included that of vitreous membranes and strands with white cells throughout the vitreous cavity and only 1-2+ white cells in the anterior chamber.  There is no hypopyon.  There is no pain today in the office.  The patient was believed to have based upon clinical examination and B-scan ultrasonography, vitreous membranes and strands which appeared to be acutely inflammatory and likely endophthalmitis.  For this reason, the patient is taken for urgent surgical   intervention via vitrectomy, vitreous tap, washings and injection of intravitreal antibiotics for suspected endophthalmitis.  The patient understands the risks and benefits of the procedure, but also to the eye from the underlying condition and that the prognosis is based upon the type of organism discovered and if it responds to treatment.  After appropriate signed consent was  obtained, the patient was taken to the operating room.    DESCRIPTION OF PROCEDURE:  In the operating room, the patient had appropriate monitoring begun followed by general endotracheal anesthesia.  The right preauricular region was then identified.  Surgical timeout with staff and surgeon.  Thereafter,  sterilely prepped and draped in the usual sterile fashion.  Lid speculum applied.  A 25-gauge trocar placed in the inferotemporal quadrant.  Placement was able to verify visually and infusion turned.  Superior trocars applied.  Before the infusion was  turned on, however, a 26 gauge needle was then placed through the first trocar and I aspirated approximately 0.25 mL atraumatically from the midvitreous.  This was directly sent in culturette tubes for routine culture sake.    At this time, the infusion was connected and then turned on.  Thereafter, vitrectomy was then begun using the BIOM attachment on the microscope.  Notable findings after vitreous washout that there were in fact inflammatory white cell membranes over the  posterior pole as well as peripherally.  I avoided any contact near or even near the retina.  I was able to mobilize some of the preretinal membranes over the macular region using blunt tipped aspiration and reflux as well.  Removing the premacular  inflammatory membranes, the optic nerve appeared normal.  The macula appeared normal.  There is no  occlusion of the retinal vessels at this time.  At this point, the surgical procedure was halted.  The superior trocars were removed.  The infusion  pressure was  left at 20 mm and then finally the infusion was removed.  The wounds were secure.  Intravitreal Fortaz 0.1 mL followed by vancomycin 0.1 mL was injected via the pars plana.  No complications occurred.  Residual amount of these antibiotics  were placed inferonasal subconjunctivally.  No complications occurred.  IV Fortaz and vancomycin followed.  Sterile Fox shield applied and the patient awakened from anesthesia without difficulty.  The patient tolerated the procedure well and taken to  PACU.  TN/NUANCE  D:09/06/2018 T:09/06/2018 JOB:[id number]

## 2018-09-08 MED FILL — Ceftazidime For Inj 1 GM: INTRAMUSCULAR | Qty: 1 | Status: AC

## 2018-09-13 LAB — AEROBIC/ANAEROBIC CULTURE W GRAM STAIN (SURGICAL/DEEP WOUND)
Culture: NO GROWTH
Culture: NO GROWTH

## 2019-11-05 ENCOUNTER — Encounter (INDEPENDENT_AMBULATORY_CARE_PROVIDER_SITE_OTHER): Payer: BC Managed Care – PPO | Admitting: Ophthalmology

## 2019-11-21 DIAGNOSIS — H25042 Posterior subcapsular polar age-related cataract, left eye: Secondary | ICD-10-CM | POA: Insufficient documentation

## 2019-11-21 DIAGNOSIS — H2512 Age-related nuclear cataract, left eye: Secondary | ICD-10-CM

## 2019-11-21 DIAGNOSIS — H2511 Age-related nuclear cataract, right eye: Secondary | ICD-10-CM

## 2019-11-21 DIAGNOSIS — H43822 Vitreomacular adhesion, left eye: Secondary | ICD-10-CM

## 2019-11-21 DIAGNOSIS — H35371 Puckering of macula, right eye: Secondary | ICD-10-CM | POA: Insufficient documentation

## 2019-11-21 DIAGNOSIS — H35412 Lattice degeneration of retina, left eye: Secondary | ICD-10-CM | POA: Insufficient documentation

## 2019-11-21 DIAGNOSIS — H33021 Retinal detachment with multiple breaks, right eye: Secondary | ICD-10-CM | POA: Insufficient documentation

## 2019-11-21 DIAGNOSIS — H33322 Round hole, left eye: Secondary | ICD-10-CM | POA: Insufficient documentation

## 2019-11-21 HISTORY — DX: Age-related nuclear cataract, right eye: H25.11

## 2019-11-21 HISTORY — DX: Vitreomacular adhesion, left eye: H43.822

## 2019-11-21 HISTORY — DX: Posterior subcapsular polar age-related cataract, left eye: H25.042

## 2019-11-21 HISTORY — DX: Age-related nuclear cataract, left eye: H25.12

## 2019-12-03 ENCOUNTER — Ambulatory Visit (INDEPENDENT_AMBULATORY_CARE_PROVIDER_SITE_OTHER): Payer: BC Managed Care – PPO | Admitting: Ophthalmology

## 2019-12-03 ENCOUNTER — Other Ambulatory Visit: Payer: Self-pay

## 2019-12-03 ENCOUNTER — Encounter (INDEPENDENT_AMBULATORY_CARE_PROVIDER_SITE_OTHER): Payer: Self-pay | Admitting: Ophthalmology

## 2019-12-03 DIAGNOSIS — H25042 Posterior subcapsular polar age-related cataract, left eye: Secondary | ICD-10-CM | POA: Diagnosis not present

## 2019-12-03 DIAGNOSIS — H43822 Vitreomacular adhesion, left eye: Secondary | ICD-10-CM

## 2019-12-03 DIAGNOSIS — H35371 Puckering of macula, right eye: Secondary | ICD-10-CM | POA: Diagnosis not present

## 2019-12-03 DIAGNOSIS — H2511 Age-related nuclear cataract, right eye: Secondary | ICD-10-CM

## 2019-12-03 DIAGNOSIS — H33021 Retinal detachment with multiple breaks, right eye: Secondary | ICD-10-CM

## 2019-12-03 DIAGNOSIS — H33322 Round hole, left eye: Secondary | ICD-10-CM

## 2019-12-03 DIAGNOSIS — H35412 Lattice degeneration of retina, left eye: Secondary | ICD-10-CM

## 2019-12-03 DIAGNOSIS — H2512 Age-related nuclear cataract, left eye: Secondary | ICD-10-CM | POA: Diagnosis not present

## 2019-12-03 NOTE — Patient Instructions (Signed)
Instructed to contact Dr. Kate Sable this office for new onset symptoms

## 2019-12-03 NOTE — Assessment & Plan Note (Signed)
History of retinal pexy performed inferior 6:00 meridian

## 2019-12-03 NOTE — Assessment & Plan Note (Signed)
Up as per Dr. Lucretia Roers and Dr. Vonna Kotyk

## 2019-12-03 NOTE — Progress Notes (Signed)
12/03/2019     CHIEF COMPLAINT Patient presents for Retina Follow Up   HISTORY OF PRESENT ILLNESS: Rodney Lozano is a 49 y.o. male who presents to the clinic today for:   HPI    Retina Follow Up    Patient presents with  Other.  In both eyes.  This started 7 months ago.  Severity is mild.  Duration of 7 months.  Since onset it is gradually improving.          Comments    7 Month F/U OU  Pt sts OD VA improved s/p CEIOL. No new symptoms reported OU.        Last edited by Ileana Roup, COA on 12/03/2019  9:28 AM. (History)      Referring physician: No referring provider defined for this encounter.  HISTORICAL INFORMATION:   Selected notes from the MEDICAL RECORD NUMBER    Lab Results  Component Value Date   HGBA1C  03/01/2007    5.5 (NOTE)   The ADA recommends the following therapeutic goals for glycemic   control related to Hgb A1C measurement:   Goal of Therapy:   < 7.0% Hgb A1C   Action Suggested:  > 8.0% Hgb A1C   Ref:  Diabetes Care, 22, Suppl. 1, 1999     CURRENT MEDICATIONS: Current Outpatient Medications (Ophthalmic Drugs)  Medication Sig  . DUREZOL 0.05 % EMUL   . PROLENSA 0.07 % SOLN   . ciprofloxacin (CILOXAN) 0.3 % ophthalmic solution Place 1 drop into the right eye 4 (four) times daily. UNTIL FINISHED (Patient not taking: Reported on 12/03/2019)  . prednisoLONE acetate (PRED FORTE) 1 % ophthalmic suspension Place 1 drop into the right eye 4 (four) times daily. UNTIL FINISHED   No current facility-administered medications for this visit. (Ophthalmic Drugs)   Current Outpatient Medications (Other)  Medication Sig  . hydrochlorothiazide (HYDRODIURIL) 25 MG tablet Take 25 mg by mouth daily.  Marland Kitchen ibuprofen (ADVIL) 200 MG tablet Take 400 mg by mouth every 6 (six) hours as needed for mild pain.  . pantoprazole (PROTONIX) 40 MG tablet Take 40 mg by mouth daily before breakfast.   . rosuvastatin (CRESTOR) 10 MG tablet Take 10 mg by mouth daily.    Marland Kitchen UNKNOWN TO PATIENT Take 1 capsule by mouth See admin instructions. Eye health supplement otc capsule (name unknown to patient): Take 1 capsule by mouth in the morning with breakfast   No current facility-administered medications for this visit. (Other)      REVIEW OF SYSTEMS:    ALLERGIES No Known Allergies  PAST MEDICAL HISTORY Past Medical History:  Diagnosis Date  . GERD (gastroesophageal reflux disease)   . Hypertension   . Nuclear sclerotic cataract of right eye 11/21/2019   Past Surgical History:  Procedure Laterality Date  . EYE SURGERY    . PARS PLANA VITRECTOMY Right 09/06/2018   Procedure: PARS PLANA VITRECTOMY WITH 25 GAUGE with injection of antibiotics - Endophthalmitis protocol;  Surgeon: Edmon Crape, MD;  Location: [facility] OR;  Service: Ophthalmology;  Laterality: Right;    FAMILY HISTORY History reviewed. No pertinent family history.  SOCIAL HISTORY Social History   Tobacco Use  . Smoking status: Never Smoker  . Smokeless tobacco: Never Used  Vaping Use  . Vaping Use: Never used  Substance Use Topics  . Alcohol use: Never  . Drug use: Never         OPHTHALMIC EXAM:  Base Eye Exam    Visual  Acuity (ETDRS)      Right Left   Dist [place] 20/40 +1    Dist cc  20/30   Dist ph [place] NI    Dist ph cc  20/20 -1   Correction: Glasses       Tonometry (Tonopen, 9:29 AM)      Right Left   Pressure 22 15       Pupils      Dark Light Shape React APD   Right 6 5 Irregular Slow None   Left 6 5 Round Slow None       Visual Fields (Counting fingers)      Left Right    Full Full       Extraocular Movement      Right Left    Full Full       Neuro/Psych    Oriented x3: Yes   Mood/Affect: Normal       Dilation    Both eyes: 1.0% Mydriacyl, 2.5% Phenylephrine @ 9:34 AM        Slit Lamp and Fundus Exam    External Exam      Right Left   External Normal Normal       Slit Lamp Exam      Right Left   Lids/Lashes Normal Normal    Conjunctiva/Sclera White and quiet White and quiet   Cornea Clear Clear   Anterior Chamber Deep and quiet Deep and quiet   Iris Round and reactive Round and reactive   Lens Posterior chamber intraocular lens, Centered posterior chamber intraocular lens 2+ Nuclear sclerosis   Anterior Vitreous Normal Normal       Fundus Exam      Right Left   Posterior Vitreous Clear vitrectomized Posterior vitreous detachment   Disc Thin rim Normal   C/D Ratio .7 0.3   Macula Normal Normal   Vessels Normal Normal   Periphery Buckle, retina attached, good chorioretinal scarring Chorioretinal scarring laser retinopexy urine.          IMAGING AND PROCEDURES  Imaging and Procedures for 12/03/19  OCT, Retina - OU - Both Eyes       Right Eye Quality was good. Scan locations included subfoveal. Central Foveal Thickness: 344. Progression has been stable. Findings include abnormal foveal contour.   Left Eye Quality was good. Scan locations included subfoveal. Central Foveal Thickness: 309. Progression has been stable. Findings include normal foveal contour.   Notes OD, minor retinal thickening nasal to the fovea yet with no active maculopathy.  Observe                ASSESSMENT/PLAN:  Lattice degeneration of retina, left History of retinal pexy performed inferior 6:00 meridian  Nuclear sclerotic cataract of left eye Up as per Dr. Lucretia Roers and Dr. 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 Right epiretinal membrane  H35.371 OCT, Retina - OU - Both Eyes  2. Nuclear sclerotic cataract of right eye  H25.11   3. Nuclear sclerotic cataract of left eye  H25.12   4. Posterior subcapsular age-related cataract of left eye  H25.042   5. Round hole of left eye  H33.322   6. Lattice degeneration of retina, left  H35.412   7. 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  Right eye looks great status post recent cataract surgery with intraocular lens placement.  2.  There are no new findings of the left eye with a history of lattice degeneration, may proceed with cataract surgery as scheduled or at any time  3.  Ophthalmic Meds Ordered this visit:  No orders of the defined types were placed in this encounter.      Return if symptoms worsen or fail to improve, for Dr. Velna Ochs as scheduled.  Patient Instructions  Instructed to contact Dr. Kate Sable this office for new onset symptoms    Explained the diagnoses, plan, and follow up with the patient and they expressed understanding.  Patient expressed understanding of the importance of proper follow up care.   Alford Highland Jaeden Messer M.D. Diseases & Surgery of the Retina and Vitreous [facility] 12/03/19     Abbreviations: M myopia (nearsighted); A astigmatism; H hyperopia (farsighted); P presbyopia; Mrx spectacle prescription;  CTL contact lenses; OD right eye; OS left eye; OU both eyes  XT exotropia; ET esotropia; PEK punctate epithelial keratitis; PEE punctate epithelial erosions; DES dry eye syndrome; MGD meibomian gland dysfunction; ATs artificial tears; PFAT's preservative free artificial tears; NSC nuclear sclerotic cataract; PSC posterior subcapsular cataract; ERM epi-retinal membrane; PVD posterior vitreous detachment; RD retinal detachment; DM diabetes mellitus; DR diabetic retinopathy; NPDR non-proliferative diabetic retinopathy; PDR proliferative diabetic retinopathy; CSME clinically significant macular edema; DME diabetic macular edema; dbh dot blot hemorrhages; CWS cotton wool spot; POAG primary open angle glaucoma; C/D cup-to-disc ratio; HVF humphrey visual field; GVF goldmann visual field; OCT optical coherence tomography; IOP intraocular pressure; BRVO Branch retinal vein occlusion; CRVO central retinal vein occlusion; CRAO central retinal artery  occlusion; BRAO branch retinal artery occlusion; RT retinal tear; SB scleral buckle; PPV pars plana vitrectomy; VH Vitreous hemorrhage; PRP panretinal laser photocoagulation; IVK intravitreal kenalog; VMT vitreomacular traction; MH Macular hole;  NVD neovascularization of the disc; NVE neovascularization elsewhere; AREDS age related eye disease study; ARMD age related macular degeneration; POAG primary open angle glaucoma; EBMD epithelial/anterior basement membrane dystrophy; ACIOL anterior chamber intraocular lens; IOL intraocular lens; PCIOL posterior chamber intraocular lens; Phaco/IOL phacoemulsification with intraocular lens placement; PRK photorefractive keratectomy; LASIK laser assisted in situ keratomileusis; HTN hypertension; DM diabetes mellitus; COPD chronic obstructive pulmonary disease

## 2019-12-03 NOTE — Assessment & Plan Note (Signed)
Repaired some year and a half previous

## 2020-12-19 ENCOUNTER — Encounter (INDEPENDENT_AMBULATORY_CARE_PROVIDER_SITE_OTHER): Payer: Self-pay

## 2021-01-19 ENCOUNTER — Encounter (INDEPENDENT_AMBULATORY_CARE_PROVIDER_SITE_OTHER): Payer: Self-pay

## 2021-02-12 ENCOUNTER — Encounter (INDEPENDENT_AMBULATORY_CARE_PROVIDER_SITE_OTHER): Payer: BC Managed Care – PPO | Admitting: Ophthalmology

## 2021-03-05 ENCOUNTER — Ambulatory Visit (INDEPENDENT_AMBULATORY_CARE_PROVIDER_SITE_OTHER): Payer: BC Managed Care – PPO | Admitting: Ophthalmology

## 2021-03-05 ENCOUNTER — Encounter (INDEPENDENT_AMBULATORY_CARE_PROVIDER_SITE_OTHER): Payer: Self-pay | Admitting: Ophthalmology

## 2021-03-05 ENCOUNTER — Other Ambulatory Visit: Payer: Self-pay

## 2021-03-05 DIAGNOSIS — H35371 Puckering of macula, right eye: Secondary | ICD-10-CM | POA: Diagnosis not present

## 2021-03-05 DIAGNOSIS — Z8669 Personal history of other diseases of the nervous system and sense organs: Secondary | ICD-10-CM | POA: Insufficient documentation

## 2021-03-05 DIAGNOSIS — H25042 Posterior subcapsular polar age-related cataract, left eye: Secondary | ICD-10-CM

## 2021-03-05 DIAGNOSIS — H33002 Unspecified retinal detachment with retinal break, left eye: Secondary | ICD-10-CM

## 2021-03-05 DIAGNOSIS — H35412 Lattice degeneration of retina, left eye: Secondary | ICD-10-CM | POA: Diagnosis not present

## 2021-03-05 MED ORDER — PREDNISOLONE ACETATE 1 % OP SUSP: 1.0000 [drp] | Freq: Four times a day (QID) | OPHTHALMIC | 0 refills | Status: AC

## 2021-03-05 MED ORDER — OFLOXACIN 0.3 % OP SOLN: 1.0000 [drp] | Freq: Four times a day (QID) | OPHTHALMIC | 0 refills | Status: AC

## 2021-03-05 NOTE — Assessment & Plan Note (Signed)
Not dilated today

## 2021-03-05 NOTE — Assessment & Plan Note (Signed)
Stable attached

## 2021-03-05 NOTE — Assessment & Plan Note (Signed)
Under the care of Dr. Rogue Jury

## 2021-03-05 NOTE — Progress Notes (Signed)
03/05/2021     CHIEF COMPLAINT Patient presents for  Chief Complaint  Patient presents with   Retina Evaluation      HISTORY OF PRESENT ILLNESS: Rodney Lozano is a 51 y.o. male who presents to the clinic today for:   HPI     Retina Evaluation           Laterality: right eye   Onset: 3 weeks ago   Duration: 3 weeks   Associated Symptoms: Distortion.  Negative for Flashes, Floaters, Blind Spot and Pain         Comments   WIP- Macular Pucker OD-referred by Dr. Nelva Bush  Pt states, "Dr. Zadie Rhine fixed my retinal detachment in my right eye. Now for the last 3 weeks I have being noticing distortion on the edges of things that I know are straight. They look wavy. Sometimes letters looked raised and out of focus." Pt denies any FOL or Floaters  No symptoms OS       Last edited by Hurman Horn, MD on 03/05/2021  4:54 PM.      Referring physician: Odette Fraction [place],  [place] [address]  HISTORICAL INFORMATION:   Selected notes from the MEDICAL RECORD NUMBER    Lab Results  Component Value Date   HGBA1C  03/01/2007    5.5 (NOTE)   The ADA recommends the following therapeutic goals for glycemic   control related to Hgb A1C measurement:   Goal of Therapy:   < 7.0% Hgb A1C   Action Suggested:  > 8.0% Hgb A1C   Ref:  Diabetes Care, 22, Suppl. 1, 1999     CURRENT MEDICATIONS: Current Outpatient Medications (Ophthalmic Drugs)  Medication Sig   ofloxacin (OCUFLOX) 0.3 % ophthalmic solution Place 1 drop into the left eye in the morning, at noon, in the evening, and at bedtime for 5 days.   prednisoLONE acetate (PRED FORTE) 1 % ophthalmic suspension Place 1 drop into the left eye in the morning, at noon, in the evening, and at bedtime for 21 doses.   ciprofloxacin (CILOXAN) 0.3 % ophthalmic solution Place 1 drop into the right eye 4 (four) times daily. UNTIL FINISHED (Patient not taking: Reported on 12/03/2019)   DUREZOL 0.05 % EMUL     prednisoLONE acetate (PRED FORTE) 1 % ophthalmic suspension Place 1 drop into the right eye 4 (four) times daily. UNTIL FINISHED   PROLENSA 0.07 % SOLN    No current facility-administered medications for this visit. (Ophthalmic Drugs)   Current Outpatient Medications (Other)  Medication Sig   hydrochlorothiazide (HYDRODIURIL) 25 MG tablet Take 25 mg by mouth daily.   ibuprofen (ADVIL) 200 MG tablet Take 400 mg by mouth every 6 (six) hours as needed for mild pain.   pantoprazole (PROTONIX) 40 MG tablet Take 40 mg by mouth daily before breakfast.    rosuvastatin (CRESTOR) 10 MG tablet Take 10 mg by mouth daily.    UNKNOWN TO PATIENT Take 1 capsule by mouth See admin instructions. Eye health supplement otc capsule (name unknown to patient): Take 1 capsule by mouth in the morning with breakfast   No current facility-administered medications for this visit. (Other)      REVIEW OF SYSTEMS:    ALLERGIES No Known Allergies  PAST MEDICAL HISTORY Past Medical History:  Diagnosis Date   GERD (gastroesophageal reflux disease)    Hypertension    Nuclear sclerotic cataract of right eye 11/21/2019   Past Surgical History:  Procedure Laterality Date   EYE SURGERY     PARS PLANA VITRECTOMY Right 09/06/2018   Procedure: PARS PLANA VITRECTOMY WITH 25 GAUGE with injection of antibiotics - Endophthalmitis protocol;  Surgeon: Hurman Horn, MD;  Location: [facility];  Service: Ophthalmology;  Laterality: Right;    FAMILY HISTORY History reviewed. No pertinent family history.  SOCIAL HISTORY Social History   Tobacco Use   Smoking status: Never   Smokeless tobacco: Never  Vaping Use   Vaping Use: Never used  Substance Use Topics   Alcohol use: Never   Drug use: Never         OPHTHALMIC EXAM:  Base Eye Exam     Visual Acuity (ETDRS)       Right Left   Dist cc 20/60 -2 20/20   Dist ph cc NI          Tonometry (Tonopen, 3:26 PM)       Right Left   Pressure 11 15          Pupils       Pupils Dark Light Shape React APD   Right PERRL 6 5 Irregular Brisk None   Left PERRL 6 5 Round Brisk None         Visual Fields (Counting fingers)       Left Right    Full Full         Extraocular Movement       Right Left    Full, Ortho Full, Ortho         Neuro/Psych     Oriented x3: Yes   Mood/Affect: Normal         Dilation     Right eye: 1.0% Mydriacyl, 2.5% Phenylephrine @ 3:25 PM           Slit Lamp and Fundus Exam     External Exam       Right Left   External Normal Normal         Slit Lamp Exam       Right Left   Lids/Lashes Normal Normal   Conjunctiva/Sclera White and quiet White and quiet   Cornea Clear Clear   Anterior Chamber Deep and quiet Deep and quiet   Iris Round and reactive Round and reactive   Lens Posterior chamber intraocular lens, Centered posterior chamber intraocular lens 2+ Nuclear sclerosis   Anterior Vitreous Normal Normal         Fundus Exam       Right Left   Posterior Vitreous Clear vitrectomized Posterior vitreous detachment   Disc Thin rim Normal   C/D Ratio .7 0.3   Macula Normal, no topographic distortion Normal   Vessels Normal Normal   Periphery Buckle, retina attached, good chorioretinal scarring but no new breaks Chorioretinal scarring laser retinopexy, with shallow subretinal fluid detachment inferotemporal centered at 4:00 meridian at an anterior to the equator, macula on            IMAGING AND PROCEDURES  Imaging and Procedures for 03/05/21  Color Fundus Photography Optos - OU - Both Eyes       Right Eye Progression has been stable. Macula : normal observations.   Left Eye Progression has been stable. Disc findings include normal observations. Macula : normal observations. Vessels : normal observations.   Notes Clear media OD, scleral buckle.  OS clear media, vitreous debris inferiorly, subretinal fluid inferotemporal macula on pseudophakic retinal detachment      OCT, Retina - OU -  Both Eyes       Right Eye Quality was good. Scan locations included subfoveal. Central Foveal Thickness: 349. Progression has been stable. Findings include abnormal foveal contour.   Left Eye Quality was good. Scan locations included subfoveal. Central Foveal Thickness: 309. Progression has been stable. Findings include normal foveal contour.   Notes OD, minor retinal thickening nasal to the fovea yet with no active maculopathy.  Vastly improved macular findings OD overall as compared to severe epiretinal membrane which was present post Retinal detachment repair prior to surgical intervention July 2020     B-Scan Ultrasound - OS - Left Eye       Quality was good. Findings included posterior vitreous detachment, vitreous opacities.   Notes Inferotemporal retinal detachment rhegmatogenous, macula on             ASSESSMENT/PLAN:  Right epiretinal membrane History of repair vitrectomy membrane peel July 2020 as a sequela of prior retinal detachment in the healing process upon its repair.  Macular findings today look great as compared to prior to membrane peel.  Acuity is stable.  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  Possible side effects of the various surgical procedures were discussed with the patient.  Possible complications of surgery were discussed with the patient.  Explained the following success rates are involved with retinal detachment surgeries: 90% rate of reattachment success with primary operation, 85% chance of 2nd operation needed (if  Pneumatic isn't done first), 70-75% chance of 3rd operation, & 50% chance of 4th & subsequent surgeries.   Patient understands that ongoing scarring of retina may lead to reoccurrence of retinal tears or detachments.  The patient's questions were answered. An informational brochure was given to the patient.  All the patient's questions were answered.   OS macular on asymptomatic shallow retinal detachment inferotemporal, pseudophakic.  Will suggest and recommend vitrectomy, internal drainage of subretinal fluid, laser retinopexy and injection short acting gas, SF6 gas under local MAC anesthesia next week     ICD-10-CM   1. 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 Right epiretinal membrane  H35.371 Color Fundus Photography Optos - OU - Both Eyes    OCT, Retina - OU - Both Eyes    3. 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 Lattice degeneration of retina, left  H35.412     5. History of retinal detachment  Z86.69       1.  OD confirm that the suspected epiretinal membrane is a residual weakening of prior very severe epiretinal membrane removed surgically 2020.  OD need no further therapy  2.  Incidental finding of low-grade low-lying macula on retinal detachment inferotemporal found on color fundus photography and confirmed on clinical examination and B-scan ultrasonography  3.  We will plan repair retinal detachment left eye via vitrectomy, endolaser drainage subretinal fluid injection SF6 gas left eye    Ophthalmic Meds Ordered this visit:  Meds ordered this encounter  Medications   ofloxacin (OCUFLOX) 0.3 % ophthalmic solution    Sig: Place 1 drop into the  left eye in the morning, at noon, in the evening, and at bedtime for 5 days.    Dispense:  5 mL    Refill:  0   prednisoLONE acetate (PRED FORTE) 1 % ophthalmic suspension    Sig: Place 1 drop into the left eye in the morning, at noon,  in the evening, and at bedtime for 21 doses.    Dispense:  5 mL    Refill:  0       Return ,,, [facility], for Schedule repair retinal detachment, vitrectomy, drain fluid, endolaser, SF6 injection, OS.  There are no Patient Instructions on file for this visit.   Explained the diagnoses, plan, and follow up with the patient and they expressed understanding.  Patient expressed understanding of the importance of proper follow up care.   Clent Demark Shabreka Coulon M.D. Diseases & Surgery of the Retina and Vitreous [facility] 03/05/21     Abbreviations: M myopia (nearsighted); A astigmatism; H hyperopia (farsighted); P presbyopia; Mrx spectacle prescription;  CTL contact lenses; OD right eye; OS left eye; OU both eyes  XT exotropia; ET esotropia; PEK punctate epithelial keratitis; PEE punctate epithelial erosions; DES dry eye syndrome; MGD meibomian gland dysfunction; ATs artificial tears; PFAT's preservative free artificial tears; [place] nuclear sclerotic cataract; PSC posterior subcapsular cataract; ERM epi-retinal membrane; PVD posterior vitreous detachment; RD retinal detachment; DM diabetes mellitus; DR diabetic retinopathy; NPDR non-proliferative diabetic retinopathy; PDR proliferative diabetic retinopathy; CSME clinically significant macular edema; DME diabetic macular edema; dbh dot blot hemorrhages; CWS cotton wool spot; POAG primary open angle glaucoma; C/D cup-to-disc ratio; HVF humphrey visual field; GVF goldmann visual field; OCT optical coherence tomography; IOP intraocular pressure; BRVO Branch retinal vein occlusion; CRVO central retinal vein occlusion; CRAO central retinal artery occlusion; BRAO branch retinal artery occlusion; RT retinal tear; SB scleral buckle; PPV pars plana vitrectomy; VH Vitreous hemorrhage; PRP panretinal laser photocoagulation; IVK intravitreal kenalog; VMT vitreomacular traction; MH Macular hole;  NVD neovascularization of the disc; NVE  neovascularization elsewhere; AREDS age related eye disease study; ARMD age related macular degeneration; POAG primary open angle glaucoma; EBMD epithelial/anterior basement membrane dystrophy; ACIOL anterior chamber intraocular lens; IOL intraocular lens; PCIOL posterior chamber intraocular lens; Phaco/IOL phacoemulsification with intraocular lens placement; [place] photorefractive keratectomy; LASIK laser assisted in situ keratomileusis; HTN hypertension; DM diabetes mellitus; COPD chronic obstructive pulmonary disease

## 2021-03-05 NOTE — Assessment & Plan Note (Signed)
History of repair vitrectomy membrane peel July 2020 as a sequela of prior retinal detachment in the healing process upon its repair.  Macular findings today look great as compared to prior to membrane peel.  Acuity is stable.

## 2021-03-05 NOTE — Assessment & Plan Note (Signed)
The nature of retinal detachment was discussed with the patient as well, as the treatment options which include pneumatic retinopexy, scleral buckling, and vitrectomy scleral buckling.  Possible side effects of the various surgical procedures were discussed with the patient.  Possible complications of surgery were discussed with the patient.  Explained the following success rates are involved with retinal detachment surgeries: 90% rate of reattachment success with primary operation, 85% chance of 2nd operation needed (if Pneumatic isn't done first), 70-75% chance of 3rd operation, & 50% chance of 4th & subsequent surgeries.   Patient understands that ongoing scarring of retina may lead to reoccurrence of retinal tears or detachments.  The patient's questions were answered. An informational brochure was given to the patient.  All the patient's questions were answered.   OS macular on asymptomatic shallow retinal detachment inferotemporal, pseudophakic.  Will suggest and recommend vitrectomy, internal drainage of subretinal fluid, laser retinopexy and injection short acting gas, SF6 gas under local MAC anesthesia next week

## 2021-03-11 ENCOUNTER — Encounter (AMBULATORY_SURGERY_CENTER): Payer: BC Managed Care – PPO | Admitting: Ophthalmology

## 2021-03-11 DIAGNOSIS — H33022 Retinal detachment with multiple breaks, left eye: Secondary | ICD-10-CM

## 2021-03-12 ENCOUNTER — Encounter (INDEPENDENT_AMBULATORY_CARE_PROVIDER_SITE_OTHER): Payer: Self-pay | Admitting: Ophthalmology

## 2021-03-12 ENCOUNTER — Ambulatory Visit (INDEPENDENT_AMBULATORY_CARE_PROVIDER_SITE_OTHER): Payer: BC Managed Care – PPO | Admitting: Ophthalmology

## 2021-03-12 ENCOUNTER — Other Ambulatory Visit: Payer: Self-pay

## 2021-03-12 DIAGNOSIS — H33002 Unspecified retinal detachment with retinal break, left eye: Secondary | ICD-10-CM

## 2021-03-12 NOTE — Progress Notes (Signed)
03/12/2021     CHIEF COMPLAINT Patient presents for  Chief Complaint  Patient presents with   Post-op Follow-up      HISTORY OF PRESENT ILLNESS: Rodney Lozano is a 51 y.o. male who presents to the clinic today for:   HPI     Post-op Follow-up           Laterality: left eye   Discomfort: pain (Pt reports he had "some pain" last night but was able to relieve it with Tylenol), itching and tearing         Comments   1 day post-op PPV/Endolaser/ Gas injection  Pt states, "I did have a little pain last night but I was able to relieve that with Tylenol. I did have some itching and tearing as well." Pt states [facility] OU stable since last visit. Pt denies FOL, floaters, or ocular pain OU.    Pt reports that he started Ofloxacin and Pred two days prior to surgery QID OS        Last edited by Demetrios Loll, COA on 03/12/2021  9:01 AM.      Referring physician: No referring provider defined for this encounter.  HISTORICAL INFORMATION:   Selected notes from the MEDICAL RECORD NUMBER    Lab Results  Component Value Date   HGBA1C  03/01/2007    5.5 (NOTE)   The ADA recommends the following therapeutic goals for glycemic   control related to Hgb A1C measurement:   Goal of Therapy:   < 7.0% Hgb A1C   Action Suggested:  > 8.0% Hgb A1C   Ref:  Diabetes Care, 22, Suppl. 1, 1999     CURRENT MEDICATIONS: Current Outpatient Medications (Ophthalmic Drugs)  Medication Sig   ciprofloxacin (CILOXAN) 0.3 % ophthalmic solution Place 1 drop into the right eye 4 (four) times daily. UNTIL FINISHED (Patient not taking: Reported on 12/03/2019)   DUREZOL 0.05 % EMUL    prednisoLONE acetate (PRED FORTE) 1 % ophthalmic suspension Place 1 drop into the right eye 4 (four) times daily. UNTIL FINISHED   PROLENSA 0.07 % SOLN    No current facility-administered medications for this visit. (Ophthalmic Drugs)   Current Outpatient Medications (Other)  Medication Sig   hydrochlorothiazide  (HYDRODIURIL) 25 MG tablet Take 25 mg by mouth daily.   ibuprofen (ADVIL) 200 MG tablet Take 400 mg by mouth every 6 (six) hours as needed for mild pain.   pantoprazole (PROTONIX) 40 MG tablet Take 40 mg by mouth daily before breakfast.    rosuvastatin (CRESTOR) 10 MG tablet Take 10 mg by mouth daily.    UNKNOWN TO PATIENT Take 1 capsule by mouth See admin instructions. Eye health supplement otc capsule (name unknown to patient): Take 1 capsule by mouth in the morning with breakfast   No current facility-administered medications for this visit. (Other)      REVIEW OF SYSTEMS:    ALLERGIES No Known Allergies  PAST MEDICAL HISTORY Past Medical History:  Diagnosis Date   GERD (gastroesophageal reflux disease)    Hypertension    Nuclear sclerotic cataract of right eye 11/21/2019   Past Surgical History:  Procedure Laterality Date   EYE SURGERY     PARS PLANA VITRECTOMY Right 09/06/2018   Procedure: PARS PLANA VITRECTOMY WITH 25 GAUGE with injection of antibiotics - Endophthalmitis protocol;  Surgeon: Edmon Crape, MD;  Location: [facility];  Service: Ophthalmology;  Laterality: Right;    FAMILY HISTORY History reviewed. No pertinent family  history.  SOCIAL HISTORY Social History   Tobacco Use   Smoking status: Never   Smokeless tobacco: Never  Vaping Use   Vaping Use: Never used  Substance Use Topics   Alcohol use: Never   Drug use: Never         OPHTHALMIC EXAM:  Base Eye Exam     Visual Acuity (ETDRS)       Right Left   Dist [place] 20/40 -1 HM   Dist ph [place] NI          Tonometry (Tonopen, 9:08 AM)       Right Left   Pressure 12 30         Tonometry #2 (Tonopen, 9:08 AM)       Right Left   Pressure  28         Pupils       Pupils Dark Light Shape React APD   Right PERRL 6 5 Irregular Brisk None   Left PERRL Dilated [employer]             Extraocular Movement       Right Left    Full, Ortho Full, Ortho         Neuro/Psych     Oriented  x3: Yes   Mood/Affect: Normal         Dilation     Left eye: 1.0% Mydriacyl, 2.5% Phenylephrine @ 9:05 AM           Slit Lamp and Fundus Exam     External Exam       Right Left   External Normal Normal         Slit Lamp Exam       Right Left   Lids/Lashes Normal Normal   Conjunctiva/Sclera White and quiet White and quiet   Cornea Clear Clear   Anterior Chamber Deep and quiet Deep and quiet   Iris Round and reactive Round and reactive   Lens Posterior chamber intraocular lens, Centered posterior chamber intraocular lens 2+ Nuclear sclerosis   Anterior Vitreous Normal Normal         Fundus Exam       Right Left   Posterior Vitreous  Posterior vitreous detachment   Disc  Normal   C/D Ratio .7 0.3   Macula  Normal   Vessels  Normal   Periphery  Chorioretinal scarring laser retinopexy, with shallow subretinal fluid detachment inferotemporal centered at 4:00 meridian at an anterior to the equator, macula on            IMAGING AND PROCEDURES  Imaging and Procedures for 03/12/21           ASSESSMENT/PLAN:  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     ICD-10-CM   1. 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  Postop day #1 status post vitrectomy, internal drainage subretinal fluid, air-fluid exchange, injection SF6 gas with laser retinopexy for temporal retinal detachment pseudophakia, macula on.  Looks great today.  Detachment extending from 1-5 o'clock meridian's temporally,  2.  Patient to commence with topical eye medications  3.  Extensive review of postoperative positioning and positioning during rest as well as during the day reviewed.  Ophthalmic Meds Ordered this visit:  No orders of the defined types were placed in this encounter.      Return in about  1 week (around 03/19/2021) for dilate, OS,  COLOR FP.  Patient Instructions  Ofloxacin  4 times daily to the operative eye  Prednisolone acetate 1 drop to the operative eye 4 times daily  Patient instructed not to refill the medications and use them for maximum of 3 weeks.  Patient instructed do not rub the eye.  Patient has the option to use the patch at night.    The patient was found to be doing well, postoperatively, and was advised in the use of drops and home care.  Patient was advised not to rub eyes. Positioning was described as well, as precautions regarding intravitreal gas, if applicable. DO NOT TRAVEL TO [place], OR IN AIRPLANE, UNTIL THE BUBBLE INSIDE THE EYE HAS DISAPPEARED.  The use of eye patch at night is optional and was discussed. May use paper tape with patch if patient has dry skin and transpore tape for oily skin.   Use topical medications as ordered.    Patient to sleep rest right side down, do not sleep flat on back do not point the nose to the sky for lengthy periods of time   Explained the diagnoses, plan, and follow up with the patient and they expressed understanding.  Patient expressed understanding of the importance of proper follow up care.   Alford Highland Ilai Hiller M.D. Diseases & Surgery of the Retina and Vitreous [facility] 03/12/21     Abbreviations: M myopia (nearsighted); A astigmatism; H hyperopia (farsighted); P presbyopia; Mrx spectacle prescription;  CTL contact lenses; OD right eye; OS left eye; OU both eyes  XT exotropia; ET esotropia; PEK punctate epithelial keratitis; PEE punctate epithelial erosions; DES dry eye syndrome; MGD meibomian gland dysfunction; ATs artificial tears; PFAT's preservative free artificial tears; [place] nuclear sclerotic cataract; PSC posterior subcapsular cataract; ERM epi-retinal membrane; PVD posterior vitreous detachment; RD retinal detachment; DM diabetes mellitus; DR diabetic retinopathy; NPDR non-proliferative diabetic retinopathy; PDR proliferative  diabetic retinopathy; CSME clinically significant macular edema; DME diabetic macular edema; dbh dot blot hemorrhages; CWS cotton wool spot; POAG primary open angle glaucoma; C/D cup-to-disc ratio; HVF humphrey visual field; GVF goldmann visual field; OCT optical coherence tomography; IOP intraocular pressure; BRVO Branch retinal vein occlusion; CRVO central retinal vein occlusion; CRAO central retinal artery occlusion; BRAO branch retinal artery occlusion; RT retinal tear; SB scleral buckle; PPV pars plana vitrectomy; VH Vitreous hemorrhage; PRP panretinal laser photocoagulation; IVK intravitreal kenalog; VMT vitreomacular traction; MH Macular hole;  NVD neovascularization of the disc; NVE neovascularization elsewhere; AREDS age related eye disease study; ARMD age related macular degeneration; POAG primary open angle glaucoma; EBMD epithelial/anterior basement membrane dystrophy; ACIOL anterior chamber intraocular lens; IOL intraocular lens; PCIOL posterior chamber intraocular lens; Phaco/IOL phacoemulsification with intraocular lens placement; PRK photorefractive keratectomy; LASIK laser assisted in situ keratomileusis; HTN hypertension; DM diabetes mellitus; COPD chronic obstructive pulmonary disease

## 2021-03-12 NOTE — Patient Instructions (Signed)
Ofloxacin  4 times daily to the operative eye  Prednisolone acetate 1 drop to the operative eye 4 times daily  Patient instructed not to refill the medications and use them for maximum of 3 weeks.  Patient instructed do not rub the eye.  Patient has the option to use the patch at night.    The patient was found to be doing well, postoperatively, and was advised in the use of drops and home care.  Patient was advised not to rub eyes. Positioning was described as well, as precautions regarding intravitreal gas, if applicable. DO NOT TRAVEL TO [place], OR IN AIRPLANE, UNTIL THE BUBBLE INSIDE THE EYE HAS DISAPPEARED.  The use of eye patch at night is optional and was discussed. May use paper tape with patch if patient has dry skin and transpore tape for oily skin.   Use topical medications as ordered.    Patient to sleep rest right side down, do not sleep flat on back do not point the nose to the sky for lengthy periods of time

## 2021-03-12 NOTE — Assessment & Plan Note (Signed)
POD #1 vitrectomy, endolaser gas injection, SF6 20%  Looks great retina completely attached.

## 2021-03-18 ENCOUNTER — Other Ambulatory Visit: Payer: Self-pay

## 2021-03-18 ENCOUNTER — Ambulatory Visit (INDEPENDENT_AMBULATORY_CARE_PROVIDER_SITE_OTHER): Payer: BC Managed Care – PPO | Admitting: Ophthalmology

## 2021-03-18 ENCOUNTER — Encounter (INDEPENDENT_AMBULATORY_CARE_PROVIDER_SITE_OTHER): Payer: Self-pay | Admitting: Ophthalmology

## 2021-03-18 DIAGNOSIS — H4062X0 Glaucoma secondary to drugs, left eye, stage unspecified: Secondary | ICD-10-CM | POA: Diagnosis not present

## 2021-03-18 DIAGNOSIS — T380X5A Adverse effect of glucocorticoids and synthetic analogues, initial encounter: Secondary | ICD-10-CM | POA: Diagnosis not present

## 2021-03-18 DIAGNOSIS — H33002 Unspecified retinal detachment with retinal break, left eye: Secondary | ICD-10-CM | POA: Diagnosis not present

## 2021-03-18 DIAGNOSIS — H43822 Vitreomacular adhesion, left eye: Secondary | ICD-10-CM

## 2021-03-18 MED ORDER — LOTEPREDNOL ETABONATE 0.5 % OP SUSP: 1.0000 [drp] | Freq: Four times a day (QID) | OPHTHALMIC | 1 refills | Status: AC

## 2021-03-18 NOTE — Patient Instructions (Addendum)
Discontinue topical prednisolone acetate left eye immediately (white or pink top) which expresses out of the bottle as a milky substance so as to help the intraocular pressure normalized  Ofloxacin 1 drop left eye 4 times daily (tan top), continue  Start new medication, Lotemax, likely a pink top, 1 drop left eye 4 times daily

## 2021-03-18 NOTE — Progress Notes (Signed)
03/18/2021     CHIEF COMPLAINT Patient presents for  Chief Complaint  Patient presents with   Post-op Follow-up      HISTORY OF PRESENT ILLNESS: Rodney Lozano is a 51 y.o. male who presents to the clinic today for:   HPI     Post-op Follow-up           Laterality: left eye   Discomfort: pain (Pt states "4 or 5 on scale of 1-10. I take tylenol. Too much to sleep with it." Tylenol helps per patient.) and tearing.  Negative for itching and foreign body sensation   Vision: is blurred at distance         Comments   1 week dilate OS, fp. Pt states vision is stable but the gas bubble has gone down some. Pt is using cipro and prednisolone QID OS. Pt states "I can see over or around the bubble I guess, but it is not clear vision."      Last edited by Nelva Nay on 03/18/2021  8:42 AM.      Referring physician: No referring provider defined for this encounter.  HISTORICAL INFORMATION:   Selected notes from the MEDICAL RECORD NUMBER    Lab Results  Component Value Date   HGBA1C  03/01/2007    5.5 (NOTE)   The ADA recommends the following therapeutic goals for glycemic   control related to Hgb A1C measurement:   Goal of Therapy:   < 7.0% Hgb A1C   Action Suggested:  > 8.0% Hgb A1C   Ref:  Diabetes Care, 22, Suppl. 1, 1999     CURRENT MEDICATIONS: Current Outpatient Medications (Ophthalmic Drugs)  Medication Sig   loteprednol (LOTEMAX) 0.5 % ophthalmic suspension Place 1 drop into the left eye 4 (four) times daily.   ciprofloxacin (CILOXAN) 0.3 % ophthalmic solution Place 1 drop into the right eye 4 (four) times daily. UNTIL FINISHED (Patient not taking: Reported on 12/03/2019)   DUREZOL 0.05 % EMUL    prednisoLONE acetate (PRED FORTE) 1 % ophthalmic suspension Place 1 drop into the right eye 4 (four) times daily. UNTIL FINISHED   PROLENSA 0.07 % SOLN    No current facility-administered medications for this visit. (Ophthalmic Drugs)   Current  Outpatient Medications (Other)  Medication Sig   hydrochlorothiazide (HYDRODIURIL) 25 MG tablet Take 25 mg by mouth daily.   ibuprofen (ADVIL) 200 MG tablet Take 400 mg by mouth every 6 (six) hours as needed for mild pain.   pantoprazole (PROTONIX) 40 MG tablet Take 40 mg by mouth daily before breakfast.    rosuvastatin (CRESTOR) 10 MG tablet Take 10 mg by mouth daily.    UNKNOWN TO PATIENT Take 1 capsule by mouth See admin instructions. Eye health supplement otc capsule (name unknown to patient): Take 1 capsule by mouth in the morning with breakfast   No current facility-administered medications for this visit. (Other)      REVIEW OF SYSTEMS:    ALLERGIES No Known Allergies  PAST MEDICAL HISTORY Past Medical History:  Diagnosis Date   GERD (gastroesophageal reflux disease)    Hypertension    Nuclear sclerotic cataract of right eye 11/21/2019   Past Surgical History:  Procedure Laterality Date   EYE SURGERY     PARS PLANA VITRECTOMY Right 09/06/2018   Procedure: PARS PLANA VITRECTOMY WITH 25 GAUGE with injection of antibiotics - Endophthalmitis protocol;  Surgeon: Edmon Crape, MD;  Location: [facility] OR;  Service: Ophthalmology;  Laterality:  Right;    FAMILY HISTORY History reviewed. No pertinent family history.  SOCIAL HISTORY Social History   Tobacco Use   Smoking status: Never   Smokeless tobacco: Never  Vaping Use   Vaping Use: Never used  Substance Use Topics   Alcohol use: Never   Drug use: Never         OPHTHALMIC EXAM:  Base Eye Exam     Visual Acuity (ETDRS)       Right Left   Dist cc 20/30 -2 20/30 -2    Correction: Glasses         Tonometry (Tonopen, 8:45 AM)       Right Left   Pressure 11 35         Tonometry #2 (Tonopen, 8:47 AM)       Right Left   Pressure  32         Pupils       Dark Shape React APD   Right  Irregular Minimal None   Left Dilated  fixed None         Visual Fields (Counting fingers)       Left  Right    Full Full         Extraocular Movement       Right Left    Full Full         Neuro/Psych     Oriented x3: Yes   Mood/Affect: Normal         Dilation     Left eye: 1.0% Mydriacyl, 2.5% Phenylephrine @ 8:45 AM           Slit Lamp and Fundus Exam     External Exam       Right Left   External Normal Normal         Slit Lamp Exam       Right Left   Lids/Lashes Normal Normal   Conjunctiva/Sclera White and quiet White and quiet   Cornea Clear Clear   Anterior Chamber Deep and quiet Deep and quiet   Iris Round and reactive Mid dilated   Lens Posterior chamber intraocular lens, Centered posterior chamber intraocular lens Centered posterior chamber intraocular lens   Anterior Vitreous Normal Normal         Fundus Exam       Right Left   Posterior Vitreous  Clear and avitric   Disc  Normal   C/D Ratio  0.3   Macula  Normal   Vessels  Normal   Periphery  Chorioretinal scarring laser retinopexy, temporally around breaks as well as around retinotomy.  No subretinal fluid remains            IMAGING AND PROCEDURES  Imaging and Procedures for 03/18/21  Color Fundus Photography Optos - OU - Both Eyes       Right Eye Progression has been stable. Macula : normal observations.   Left Eye Progression has been stable. Disc findings include normal observations. Macula : normal observations. Vessels : normal observations.   Notes Clear media OD, scleral buckle.  OS clear media, with 35% gas remaining, retina attached, good retinopexy temporal and inferotemporally OS             ASSESSMENT/PLAN:  Steroid induced glaucoma, left eye Will need to immediately taper and discontinue topical Pred forte left eye.  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 Steroid induced glaucoma, left eye  H40.62X0    T38.0X5A     3. Vitreomacular adhesion of left eye  H43.822       1.  OS looks great 1 week postop retinal detachment repair via vitrectomy, internal drainage subretinal fluid, endolaser retinopexy and gas injection  2.  3.  Ophthalmic Meds Ordered this visit:  Meds ordered this encounter  Medications   loteprednol (LOTEMAX) 0.5 % ophthalmic suspension    Sig: Place 1 drop into the left eye 4 (four) times daily.    Dispense:  5 mL    Refill:  1       Return in about 1 week (around 03/25/2021) for dilate, OS, COLOR FP.  Patient Instructions  Discontinue topical prednisolone acetate left eye immediately (white or pink top) which expresses out of the bottle as a milky substance so as to help the intraocular pressure normalized   Explained the diagnoses, plan, and follow up with the patient and they expressed understanding.  Patient expressed understanding of the importance of proper follow up care.   Alford Highland Marquell Saenz M.D. Diseases & Surgery of the Retina and Vitreous [facility] 03/18/21     Abbreviations: M myopia (nearsighted); A astigmatism; H hyperopia (farsighted); P presbyopia; Mrx spectacle prescription;  CTL contact lenses; OD right eye; OS left eye; OU both eyes  XT exotropia; ET esotropia; PEK punctate epithelial keratitis; PEE punctate epithelial erosions; DES dry eye syndrome; MGD meibomian gland dysfunction; ATs artificial tears; PFAT's preservative free artificial tears; NSC nuclear sclerotic cataract; PSC posterior subcapsular cataract; ERM epi-retinal  membrane; PVD posterior vitreous detachment; RD retinal detachment; DM diabetes mellitus; DR diabetic retinopathy; NPDR non-proliferative diabetic retinopathy; PDR proliferative diabetic retinopathy; CSME clinically significant macular edema; DME diabetic macular edema; dbh dot blot hemorrhages; CWS cotton wool spot; POAG primary open angle glaucoma; C/D cup-to-disc ratio; HVF humphrey visual field; GVF goldmann visual field; OCT optical coherence tomography; IOP intraocular pressure; BRVO Branch retinal vein occlusion; CRVO central retinal vein occlusion; CRAO central retinal artery occlusion; BRAO branch retinal artery occlusion; RT retinal tear; SB scleral buckle; PPV pars plana vitrectomy; VH Vitreous hemorrhage; PRP panretinal laser photocoagulation; IVK intravitreal kenalog; VMT vitreomacular traction; MH Macular hole;  NVD neovascularization of the disc; NVE neovascularization elsewhere; AREDS age related eye disease study; ARMD age related macular degeneration; POAG primary open angle glaucoma; EBMD epithelial/anterior basement membrane dystrophy; ACIOL anterior chamber intraocular lens; IOL intraocular lens; PCIOL posterior chamber intraocular lens; Phaco/IOL phacoemulsification with intraocular lens placement; PRK photorefractive keratectomy; LASIK laser assisted in situ keratomileusis; HTN hypertension; DM diabetes mellitus; COPD chronic obstructive pulmonary disease

## 2021-03-18 NOTE — Assessment & Plan Note (Signed)
Will need to immediately taper and discontinue topical Pred forte left eye.  We will need to however start topical Lotemax as a substitute to provide some ocular surface and anti-inflammation without high risk of glaucoma

## 2021-03-18 NOTE — Assessment & Plan Note (Signed)
OS looks great with retina reattached nicely today  Positioning patient to continue the reading position and while he is awake for the next 2 days and to sleep on his right side down until the gas bubble is gone likely in another 6 days

## 2021-03-25 ENCOUNTER — Ambulatory Visit (INDEPENDENT_AMBULATORY_CARE_PROVIDER_SITE_OTHER): Payer: BC Managed Care – PPO | Admitting: Ophthalmology

## 2021-03-25 ENCOUNTER — Other Ambulatory Visit: Payer: Self-pay

## 2021-03-25 ENCOUNTER — Encounter (INDEPENDENT_AMBULATORY_CARE_PROVIDER_SITE_OTHER): Payer: Self-pay | Admitting: Ophthalmology

## 2021-03-25 DIAGNOSIS — T380X5A Adverse effect of glucocorticoids and synthetic analogues, initial encounter: Secondary | ICD-10-CM

## 2021-03-25 DIAGNOSIS — Z961 Presence of intraocular lens: Secondary | ICD-10-CM | POA: Insufficient documentation

## 2021-03-25 DIAGNOSIS — H4062X0 Glaucoma secondary to drugs, left eye, stage unspecified: Secondary | ICD-10-CM

## 2021-03-25 DIAGNOSIS — H33002 Unspecified retinal detachment with retinal break, left eye: Secondary | ICD-10-CM

## 2021-03-25 DIAGNOSIS — H43822 Vitreomacular adhesion, left eye: Secondary | ICD-10-CM

## 2021-03-25 MED ORDER — TIMOLOL MALEATE 0.5 % OP SOLN: 1.0000 [drp] | Freq: Every day | OPHTHALMIC | 0 refills | Status: DC

## 2021-03-25 NOTE — Assessment & Plan Note (Signed)
Condition resolved 

## 2021-03-25 NOTE — Assessment & Plan Note (Signed)
Stable

## 2021-03-25 NOTE — Patient Instructions (Addendum)
Left eye  Patient to discontinue Lotemax or FML to the left eye because of steroid-induced glaucoma temporary  Patient to commence use of timolol 0.5% (yellow top) 1 drop left eye each morning to lower the intraocular pressure left eye  Upon return next week with technician if intraocular pressure measurement is less than 21, may discontinue the yellow top  Otherwise if the intraocular pressure is still above 21, patient will continue on timolol once daily for 5-week and follow-up Dr. Luciana Axe

## 2021-03-25 NOTE — Assessment & Plan Note (Signed)
OS persistent even on Lotemax, will need to discontinue Lotemax today  We will commence therapy topically with 1 drop timolol, 1 drop each morning only to the left eye

## 2021-03-25 NOTE — Progress Notes (Signed)
03/25/2021     CHIEF COMPLAINT Patient presents for  Chief Complaint  Patient presents with   Post-op Follow-up      HISTORY OF PRESENT ILLNESS: Rodney Lozano is a 51 y.o. male who presents to the clinic today for:   HPI     Post-op Follow-up           Laterality: left eye   Discomfort: pain.  Negative for itching and tearing   Vision: is improved         Comments   1 week dilate OS, color fp. PO sx 03/11/2021,  vitrectomy, endolaser gas injection, SF6 20%.  Pt states vision is a little more clear, states "there was a small black dot in my vision in my left eye this morning but now it seems to be gone." Pt is using Ofloxacin and Lotemax OS QID. D/c'd Prolenasa and Ciprofloxacin, Durezol that are on medication list. Pt states he has experienced some eye pain, scale 3/10, is still taking tylenol.      Last edited by Nelva Nay on 03/25/2021  9:33 AM.      Referring physician: No referring provider defined for this encounter.  HISTORICAL INFORMATION:   Selected notes from the MEDICAL RECORD NUMBER    Lab Results  Component Value Date   HGBA1C  03/01/2007    5.5 (NOTE)   The ADA recommends the following therapeutic goals for glycemic   control related to Hgb A1C measurement:   Goal of Therapy:   < 7.0% Hgb A1C   Action Suggested:  > 8.0% Hgb A1C   Ref:  Diabetes Care, 22, Suppl. 1, 1999     CURRENT MEDICATIONS: Current Outpatient Medications (Ophthalmic Drugs)  Medication Sig   timolol (TIMOPTIC) 0.5 % ophthalmic solution Place 1 drop into the left eye daily.   ciprofloxacin (CILOXAN) 0.3 % ophthalmic solution Place 1 drop into the right eye 4 (four) times daily. UNTIL FINISHED (Patient not taking: Reported on 12/03/2019)   DUREZOL 0.05 % EMUL    loteprednol (LOTEMAX) 0.5 % ophthalmic suspension Place 1 drop into the left eye 4 (four) times daily.   prednisoLONE acetate (PRED FORTE) 1 % ophthalmic suspension Place 1 drop into the right eye 4  (four) times daily. UNTIL FINISHED   PROLENSA 0.07 % SOLN    No current facility-administered medications for this visit. (Ophthalmic Drugs)   Current Outpatient Medications (Other)  Medication Sig   hydrochlorothiazide (HYDRODIURIL) 25 MG tablet Take 25 mg by mouth daily.   ibuprofen (ADVIL) 200 MG tablet Take 400 mg by mouth every 6 (six) hours as needed for mild pain.   pantoprazole (PROTONIX) 40 MG tablet Take 40 mg by mouth daily before breakfast.    rosuvastatin (CRESTOR) 10 MG tablet Take 10 mg by mouth daily.    UNKNOWN TO PATIENT Take 1 capsule by mouth See admin instructions. Eye health supplement otc capsule (name unknown to patient): Take 1 capsule by mouth in the morning with breakfast   No current facility-administered medications for this visit. (Other)      REVIEW OF SYSTEMS: ROS   Negative for: Constitutional, Gastrointestinal, Neurological, Skin, Genitourinary, Musculoskeletal, HENT, Endocrine, Cardiovascular, Eyes, Respiratory, Psychiatric, Allergic/Imm, Heme/Lymph Last edited by Edmon Crape, MD on 03/25/2021 10:24 AM.       ALLERGIES No Known Allergies  PAST MEDICAL HISTORY Past Medical History:  Diagnosis Date   GERD (gastroesophageal reflux disease)    Hypertension    Nuclear sclerotic cataract  of left eye 11/21/2019   Nuclear sclerotic cataract of right eye 11/21/2019   Posterior subcapsular age-related cataract of left eye 11/21/2019   Vitreomacular adhesion of left eye 11/21/2019   Resolved with vitrectomy repair retinal detachment 03-11-2021   Past Surgical History:  Procedure Laterality Date   EYE SURGERY     PARS PLANA VITRECTOMY Right 09/06/2018   Procedure: PARS PLANA VITRECTOMY WITH 25 GAUGE with injection of antibiotics - Endophthalmitis protocol;  Surgeon: Edmon Crape, MD;  Location: [facility] OR;  Service: Ophthalmology;  Laterality: Right;    FAMILY HISTORY History reviewed. No pertinent family history.  SOCIAL HISTORY Social History    Tobacco Use   Smoking status: Never   Smokeless tobacco: Never  Vaping Use   Vaping Use: Never used  Substance Use Topics   Alcohol use: Never   Drug use: Never         OPHTHALMIC EXAM:  Base Eye Exam     Visual Acuity (ETDRS)       Right Left   Dist cc 20/30 -1 20/25 +1    Correction: Glasses         Tonometry (Tonopen, 9:30 AM)       Right Left   Pressure 14 34         Tonometry #2 (Tonopen, 9:32 AM)       Right Left   Pressure  32         Pupils       Dark Shape React APD   Right 5 Irregular Minimal None   Left Dilated Irregular Minimal None         Extraocular Movement       Right Left    Full Full         Neuro/Psych     Oriented x3: Yes   Mood/Affect: Normal         Dilation     Left eye: 1.0% Mydriacyl, 2.5% Phenylephrine @ 9:30 AM           Slit Lamp and Fundus Exam     External Exam       Right Left   External Normal Normal         Slit Lamp Exam       Right Left   Lids/Lashes Normal Normal   Conjunctiva/Sclera White and quiet White and quiet   Cornea Clear Clear   Anterior Chamber Deep and quiet Deep and quiet   Iris Round and reactive Mid dilated   Lens Posterior chamber intraocular lens, Centered posterior chamber intraocular lens Centered posterior chamber intraocular lens   Anterior Vitreous Normal Normal         Fundus Exam       Right Left   Posterior Vitreous  Clear and avitric   Disc  Normal   C/D Ratio  0.3   Macula  Normal   Vessels  Normal   Periphery  Chorioretinal scarring laser retinopexy, temporally around breaks as well as around retinotomy.  No subretinal fluid remains, no new breaks            IMAGING AND PROCEDURES  Imaging and Procedures for 03/25/21  Color Fundus Photography Optos - OU - Both Eyes       Right Eye Progression has been stable. Macula : normal observations.   Left Eye Progression has been stable. Disc findings include normal observations.  Macula : normal observations. Vessels : normal observations.   Notes Clear media OD, scleral buckle.  OS clear media, no gas remaining, retina attached, good retinopexy temporal and inferotemporally OS, no fluid             ASSESSMENT/PLAN:  Steroid induced glaucoma, left eye OS persistent even on Lotemax, will need to discontinue Lotemax today  We will commence therapy topically with 1 drop timolol, 1 drop each morning only to the left eye  Vitreomacular adhesion of left eye Condition resolved  Pseudophakia of left eye Stable     ICD-10-CM   1. 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 Steroid induced glaucoma, left eye  H40.62X0    T38.0X5A     3. Vitreomacular adhesion of left eye  H43.822     4. Pseudophakia of left eye  Z96.1       1.  OS looks great, macula and peripheral retina continue to be reattached now 2 weeks postsurgical repair  2.  Steroid induced glaucoma, discontinue all steroids, and other top medications left eye today.  3.  Patient to start timolol (yellow top) 1 drop left eye daily  4.  Next week with technician if intraocular pressure is less than 21 mm, okay in the left eye to discontinue the timolol otherwise continue it until return follow-up visit with Dr. Luciana Axeankin in 5 weeks  Ophthalmic Meds Ordered this visit:  Meds ordered this encounter  Medications   timolol (TIMOPTIC) 0.5 % ophthalmic solution    Sig: Place 1 drop into the left eye daily.    Dispense:  5 mL    Refill:  0       Return in about 1 week (around 04/01/2021) for OD, OS, IOP check TECH only,,,,, 5 weeks Dr. Luciana Axeankin, dilate OS, COLOR FP.  Patient Instructions  Left eye  Patient to discontinue Lotemax or FML to the left eye because of steroid-induced glaucoma temporary  Patient to commence use of timolol 0.5% (yellow top) 1 drop left eye each morning to lower the intraocular pressure left  eye   Explained the diagnoses, plan, and follow up with the patient and they expressed understanding.  Patient expressed understanding of the importance of proper follow up care.   Alford HighlandGary A. Lynessa Almanzar M.D. Diseases & Surgery of the Retina and Vitreous [facility] 03/25/21     Abbreviations: M myopia (nearsighted); A astigmatism; H hyperopia (farsighted); P presbyopia; Mrx spectacle prescription;  CTL contact lenses; OD right eye; OS left eye; OU both eyes  XT exotropia; ET esotropia; PEK punctate epithelial keratitis; PEE punctate epithelial erosions; DES dry eye syndrome; MGD meibomian gland dysfunction; ATs artificial tears; PFAT's preservative free artificial tears; NSC nuclear sclerotic cataract; PSC posterior subcapsular cataract; ERM epi-retinal membrane; PVD posterior vitreous detachment; RD retinal detachment; DM diabetes mellitus; DR diabetic retinopathy; NPDR non-proliferative diabetic retinopathy; PDR proliferative diabetic retinopathy; CSME clinically significant macular edema; DME diabetic macular edema; dbh dot blot hemorrhages; CWS cotton wool spot; POAG primary open angle glaucoma; C/D cup-to-disc ratio; HVF humphrey visual field; GVF goldmann visual field; OCT optical coherence tomography; IOP intraocular pressure; BRVO Branch retinal vein occlusion; CRVO central retinal vein occlusion; CRAO central retinal artery occlusion; BRAO branch retinal artery occlusion; RT retinal tear; SB scleral buckle; PPV pars plana vitrectomy; VH Vitreous hemorrhage; PRP panretinal laser photocoagulation; IVK intravitreal kenalog; VMT vitreomacular traction; MH Macular hole;  NVD neovascularization of the disc; NVE neovascularization elsewhere; AREDS age related eye disease study; ARMD age related macular degeneration;  POAG primary open angle glaucoma; EBMD epithelial/anterior basement membrane dystrophy; ACIOL anterior chamber intraocular lens; IOL intraocular lens; PCIOL posterior chamber  intraocular lens; Phaco/IOL phacoemulsification with intraocular lens placement; PRK photorefractive keratectomy; LASIK laser assisted in situ keratomileusis; HTN hypertension; DM diabetes mellitus; COPD chronic obstructive pulmonary disease

## 2021-04-01 ENCOUNTER — Other Ambulatory Visit: Payer: Self-pay

## 2021-04-01 ENCOUNTER — Ambulatory Visit (INDEPENDENT_AMBULATORY_CARE_PROVIDER_SITE_OTHER): Payer: BC Managed Care – PPO | Admitting: Ophthalmology

## 2021-04-01 ENCOUNTER — Encounter (INDEPENDENT_AMBULATORY_CARE_PROVIDER_SITE_OTHER): Payer: Self-pay

## 2021-04-01 DIAGNOSIS — H4062X0 Glaucoma secondary to drugs, left eye, stage unspecified: Secondary | ICD-10-CM

## 2021-04-01 DIAGNOSIS — T380X5A Adverse effect of glucocorticoids and synthetic analogues, initial encounter: Secondary | ICD-10-CM | POA: Diagnosis not present

## 2021-04-01 DIAGNOSIS — H33002 Unspecified retinal detachment with retinal break, left eye: Secondary | ICD-10-CM

## 2021-04-01 NOTE — Assessment & Plan Note (Signed)
Condition resolved, intraocular pressure is now normal off of topical steroids and on 1 drop daily of Timoptic  We will taper Timoptic to 1 drop every other day for 1 week and then discontinue

## 2021-04-01 NOTE — Progress Notes (Signed)
04/01/2021     CHIEF COMPLAINT Patient presents for  Chief Complaint  Patient presents with   Post-op Follow-up   Detached Retina    3 weeks post vitrectomy endolaser drainage subretinal fluid repair retinal detachment  1 week post follow-up today for steroid-induced glaucoma, now 1 week off of steroids in 1 week on topical Timoptic daily  HISTORY OF PRESENT ILLNESS: Rodney Lozano is a 51 y.o. male who presents to the clinic today for:   HPI     Post-op Follow-up           Laterality: left eye         Comments   Pt states no recent changes to medical history.  And recent steroid-induced glaucoma follow-up.  Patient successfully using topical Timoptic daily and no other medications in the left eye      Last edited by Edmon Crape, MD on 04/01/2021 10:59 AM.      Referring physician: No referring provider defined for this encounter.  HISTORICAL INFORMATION:   Selected notes from the MEDICAL RECORD NUMBER    Lab Results  Component Value Date   HGBA1C  03/01/2007    5.5 (NOTE)   The ADA recommends the following therapeutic goals for glycemic   control related to Hgb A1C measurement:   Goal of Therapy:   < 7.0% Hgb A1C   Action Suggested:  > 8.0% Hgb A1C   Ref:  Diabetes Care, 22, Suppl. 1, 1999     CURRENT MEDICATIONS: Current Outpatient Medications (Ophthalmic Drugs)  Medication Sig   ciprofloxacin (CILOXAN) 0.3 % ophthalmic solution Place 1 drop into the right eye 4 (four) times daily. UNTIL FINISHED (Patient not taking: Reported on 12/03/2019)   DUREZOL 0.05 % EMUL    loteprednol (LOTEMAX) 0.5 % ophthalmic suspension Place 1 drop into the left eye 4 (four) times daily.   prednisoLONE acetate (PRED FORTE) 1 % ophthalmic suspension Place 1 drop into the right eye 4 (four) times daily. UNTIL FINISHED   PROLENSA 0.07 % SOLN    timolol (TIMOPTIC) 0.5 % ophthalmic solution Place 1 drop into the left eye daily.   No current facility-administered  medications for this visit. (Ophthalmic Drugs)   Current Outpatient Medications (Other)  Medication Sig   hydrochlorothiazide (HYDRODIURIL) 25 MG tablet Take 25 mg by mouth daily.   ibuprofen (ADVIL) 200 MG tablet Take 400 mg by mouth every 6 (six) hours as needed for mild pain.   pantoprazole (PROTONIX) 40 MG tablet Take 40 mg by mouth daily before breakfast.    rosuvastatin (CRESTOR) 10 MG tablet Take 10 mg by mouth daily.    UNKNOWN TO PATIENT Take 1 capsule by mouth See admin instructions. Eye health supplement otc capsule (name unknown to patient): Take 1 capsule by mouth in the morning with breakfast   No current facility-administered medications for this visit. (Other)      REVIEW OF SYSTEMS: ROS   Negative for: Constitutional, Gastrointestinal, Neurological, Skin, Genitourinary, Musculoskeletal, HENT, Endocrine, Cardiovascular, Eyes, Respiratory, Psychiatric, Allergic/Imm, Heme/Lymph Last edited by Edmon Crape, MD on 04/01/2021 10:56 AM.       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 No pertinent family history.  SOCIAL HISTORY Social History   Tobacco Use   Smoking status: Never   Smokeless tobacco: Never  Vaping Use   Vaping Use:  Never used  Substance Use Topics   Alcohol use: Never   Drug use: Never         OPHTHALMIC EXAM:  Base Eye Exam     Visual Acuity (Snellen - Linear)       Right Left   Dist cc 20/40 -2 20/30 -2   Dist ph cc 20/30 20/20 -1         Tonometry (Tonopen, 10:54 AM)       Right Left   Pressure 11 12         Pupils       Pupils APD   Right PERRL None   Left PERRL None         Extraocular Movement       Right Left    Full, Ortho Full, Ortho         Dilation     Both eyes: 1.0% Mydriacyl @ 10:54 AM            IMAGING AND PROCEDURES  Imaging and Procedures for 04/01/21           ASSESSMENT/PLAN:  Steroid induced glaucoma, left eye Condition resolved, intraocular pressure is now normal off of topical steroids and on 1 drop daily of Timoptic  We will taper Timoptic to 1 drop every other day for 1 week and then discontinue  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 Steroid induced glaucoma, left eye  H40.62X0    T38.0X5A     2. 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  2.  3.  Ophthalmic Meds Ordered this visit:  No orders of the defined types were placed in this encounter.      Return in about 3 weeks (around 04/22/2021) for dilate, OS, COLOR FP.  Patient Instructions  Patient to continue on Timoptic 1 drop every other day for 1 week then discontinue  Patient to resume full activities including work with resumption of even heavy lifting and 1 more week   Explained the diagnoses, plan, and follow up with the patient and they expressed understanding.  Patient expressed understanding of the importance of proper follow up care.   Alford Highland Tacey Dimaggio M.D. Diseases & Surgery of the Retina and Vitreous [facility] 04/01/21     Abbreviations: M myopia (nearsighted); A  astigmatism; H hyperopia (farsighted); P presbyopia; Mrx spectacle prescription;  CTL contact lenses; OD right eye; OS left eye; OU both eyes  XT exotropia; ET esotropia; PEK punctate epithelial keratitis; PEE punctate epithelial erosions; DES dry eye syndrome; MGD meibomian gland dysfunction; ATs artificial tears; PFAT's preservative free artificial tears; NSC nuclear sclerotic cataract; PSC posterior subcapsular cataract; ERM epi-retinal membrane; PVD posterior vitreous detachment; RD retinal detachment; DM diabetes mellitus; DR diabetic  retinopathy; NPDR non-proliferative diabetic retinopathy; PDR proliferative diabetic retinopathy; CSME clinically significant macular edema; DME diabetic macular edema; dbh dot blot hemorrhages; CWS cotton wool spot; POAG primary open angle glaucoma; C/D cup-to-disc ratio; HVF humphrey visual field; GVF goldmann visual field; OCT optical coherence tomography; IOP intraocular pressure; BRVO Branch retinal vein occlusion; CRVO central retinal vein occlusion; CRAO central retinal artery occlusion; BRAO branch retinal artery occlusion; RT retinal tear; SB scleral buckle; PPV pars plana vitrectomy; VH Vitreous hemorrhage; PRP panretinal laser photocoagulation; IVK intravitreal kenalog; VMT vitreomacular traction; MH Macular hole;  NVD neovascularization of the disc; NVE neovascularization elsewhere; AREDS age related eye disease study; ARMD age related macular degeneration; POAG primary open angle glaucoma; EBMD epithelial/anterior basement membrane dystrophy; ACIOL anterior chamber intraocular lens; IOL intraocular lens; PCIOL posterior chamber intraocular lens; Phaco/IOL phacoemulsification with intraocular lens placement; Baldwin photorefractive keratectomy; LASIK laser assisted in situ keratomileusis; HTN hypertension; DM diabetes mellitus; COPD chronic obstructive pulmonary disease

## 2021-04-01 NOTE — Assessment & Plan Note (Signed)
3 weeks postop with excellent visual acuity preservation and recovery

## 2021-04-01 NOTE — Patient Instructions (Signed)
Patient to continue on Timoptic 1 drop every other day for 1 week then discontinue  Patient to resume full activities including work with resumption of even heavy lifting and 1 more week

## 2021-04-13 ENCOUNTER — Ambulatory Visit (INDEPENDENT_AMBULATORY_CARE_PROVIDER_SITE_OTHER): Payer: BC Managed Care – PPO | Admitting: Ophthalmology

## 2021-04-13 ENCOUNTER — Encounter (INDEPENDENT_AMBULATORY_CARE_PROVIDER_SITE_OTHER): Payer: Self-pay | Admitting: Ophthalmology

## 2021-04-13 ENCOUNTER — Other Ambulatory Visit: Payer: Self-pay

## 2021-04-13 DIAGNOSIS — H33002 Unspecified retinal detachment with retinal break, left eye: Secondary | ICD-10-CM

## 2021-04-13 NOTE — Assessment & Plan Note (Signed)
OS looks great post vitrectomy repair rhegmatogenous detachment temporal and inferotemporal from March 11, 2021. ? ?Blurred vision OS likely due to vitrectomy component of his repair inducing a slight shortening overall of the globe and thus have a slight refractive error ? ?Follow-up with Rodney Lozano of [facility] for final refraction at any time ?

## 2021-04-13 NOTE — Progress Notes (Signed)
04/13/2021     CHIEF COMPLAINT Patient presents for  Chief Complaint  Patient presents with   Eye Problem      HISTORY OF PRESENT ILLNESS: Rodney Lozano is a 51 y.o. male who presents to the clinic today for:   HPI   WIP- Vision changes, 3 weeks dilate OS, FP, Post op surgery from 03/11/21. Pt states "I worked a lot last week, my hours were 11-12 hour days and I noticed my vision was more blurred. It was like I was having to fight through it to try to see. I caught on my sleep over the weekend and my vision seemed to get better. I just wanted to make sure everything is okay. At a distance I am still noticing worsening blurred vision, cannot tell if it is only the left eye or both." Pt denies new FOL or floaters. Pt is not using any prescription eye drops currently. Last edited by Nelva Nay on 04/13/2021  2:38 PM.      Referring physician: Marlene Bast [address] [place],  [place] [address]  HISTORICAL INFORMATION:   Selected notes from the MEDICAL RECORD NUMBER    Lab Results  Component Value Date   HGBA1C  03/01/2007    5.5 (NOTE)   The ADA recommends the following therapeutic goals for glycemic   control related to Hgb A1C measurement:   Goal of Therapy:   < 7.0% Hgb A1C   Action Suggested:  > 8.0% Hgb A1C   Ref:  Diabetes Care, 22, Suppl. 1, 1999     CURRENT MEDICATIONS: Current Outpatient Medications (Ophthalmic Drugs)  Medication Sig   ciprofloxacin (CILOXAN) 0.3 % ophthalmic solution Place 1 drop into the right eye 4 (four) times daily. UNTIL FINISHED (Patient not taking: Reported on 12/03/2019)   DUREZOL 0.05 % EMUL    loteprednol (LOTEMAX) 0.5 % ophthalmic suspension Place 1 drop into the left eye 4 (four) times daily.   prednisoLONE acetate (PRED FORTE) 1 % ophthalmic suspension Place 1 drop into the right eye 4 (four) times daily. UNTIL FINISHED   PROLENSA 0.07 % SOLN    timolol (TIMOPTIC) 0.5 % ophthalmic solution Place 1 drop into the  left eye daily.   No current facility-administered medications for this visit. (Ophthalmic Drugs)   Current Outpatient Medications (Other)  Medication Sig   hydrochlorothiazide (HYDRODIURIL) 25 MG tablet Take 25 mg by mouth daily.   ibuprofen (ADVIL) 200 MG tablet Take 400 mg by mouth every 6 (six) hours as needed for mild pain.   pantoprazole (PROTONIX) 40 MG tablet Take 40 mg by mouth daily before breakfast.    rosuvastatin (CRESTOR) 10 MG tablet Take 10 mg by mouth daily.    UNKNOWN TO PATIENT Take 1 capsule by mouth See admin instructions. Eye health supplement otc capsule (name unknown to patient): Take 1 capsule by mouth in the morning with breakfast   No current facility-administered medications for this visit. (Other)      REVIEW OF SYSTEMS: ROS   Negative for: Constitutional, Gastrointestinal, Neurological, Skin, Genitourinary, Musculoskeletal, HENT, Endocrine, Cardiovascular, Eyes, Respiratory, Psychiatric, Allergic/Imm, Heme/Lymph Last edited by Edmon Crape, MD on 04/13/2021  3:57 PM.       ALLERGIES No Known Allergies  PAST MEDICAL HISTORY Past Medical History:  Diagnosis Date   GERD (gastroesophageal reflux disease)    Hypertension    Nuclear sclerotic cataract of left eye 11/21/2019   Nuclear sclerotic cataract of right eye 11/21/2019  Posterior subcapsular age-related cataract of left eye 11/21/2019   Vitreomacular adhesion of left eye 11/21/2019   Resolved with vitrectomy repair retinal detachment 03-11-2021   Past Surgical History:  Procedure Laterality Date   EYE SURGERY     PARS PLANA VITRECTOMY Right 09/06/2018   Procedure: PARS PLANA VITRECTOMY WITH 25 GAUGE with injection of antibiotics - Endophthalmitis protocol;  Surgeon: Edmon Crape, MD;  Location: [facility] OR;  Service: Ophthalmology;  Laterality: Right;    FAMILY HISTORY History reviewed. No pertinent family history.  SOCIAL HISTORY Social History   Tobacco Use   Smoking status: Never    Smokeless tobacco: Never  Vaping Use   Vaping Use: Never used  Substance Use Topics   Alcohol use: Never   Drug use: Never         OPHTHALMIC EXAM:  Base Eye Exam     Visual Acuity (ETDRS)       Right Left   Dist cc 20/30 -2 20/30 +1    Correction: Glasses         Tonometry (Tonopen, 2:41 PM)       Right Left   Pressure 9 14         Pupils       Dark Light Shape React APD   Right   Irregular  None   Left 7.5 7.5 Round Minimal None         Extraocular Movement       Right Left    Full Full         Neuro/Psych     Oriented x3: Yes   Mood/Affect: Normal         Dilation     Left eye: 1.0% Mydriacyl, 2.5% Phenylephrine @ 2:41 PM           Slit Lamp and Fundus Exam     External Exam       Right Left   External Normal Normal         Slit Lamp Exam       Right Left   Lids/Lashes Normal Normal   Conjunctiva/Sclera White and quiet White and quiet   Cornea Clear Clear   Anterior Chamber Deep and quiet Deep and quiet   Iris Round and reactive Mid dilated   Lens Posterior chamber intraocular lens, Centered posterior chamber intraocular lens Centered posterior chamber intraocular lens   Anterior Vitreous Normal Normal         Fundus Exam       Right Left   Posterior Vitreous  Clear and avitric   Disc  Normal   C/D Ratio  0.3   Macula  Normal, no topographic distortion   Vessels  Normal   Periphery  Chorioretinal scarring laser retinopexy, temporally around breaks as well as around retinotomy.  No subretinal fluid remains, no new breaks, looks great            IMAGING AND PROCEDURES  Imaging and Procedures for 04/13/21  Color Fundus Photography Optos - OU - Both Eyes       Right Eye Progression has been stable. Macula : normal observations.   Left Eye Progression has been stable. Disc findings include normal observations. Macula : normal observations. Vessels : normal observations.   Notes Clear media OD,  scleral buckle.  OS clear media, no gas remaining, retina attached, good retinopexy temporal and inferotemporally OS, no fluid, retina attached nicely clear media     OCT, Retina - OU - Both Eyes  Right Eye Quality was good. Scan locations included subfoveal. Central Foveal Thickness: 349. Progression has been stable. Findings include abnormal foveal contour.   Left Eye Quality was good. Scan locations included subfoveal. Central Foveal Thickness: 348. Progression has been stable. Findings include normal foveal contour.   Notes OD, minor retinal thickening nasal to the fovea yet with no active maculopathy.  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  Blurred vision OS likely due to vitrectomy component of his repair inducing a slight shortening overall of the globe and thus have a slight refractive error  Follow-up with Dr. Santiago Bumpers of [facility] for final refraction at any time     ICD-10-CM   1. 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  OS looks great, retina reattached, no new findings no new complications.  2.  Needs final refraction from Dr. Rogue Jury of [facility]  3.  Ophthalmic Meds Ordered this visit:  No orders of the defined types were placed in this encounter.      Return in about 6 weeks (around 05/25/2021) for DILATE OU, COLOR FP, OCT.  There are no Patient Instructions on file for this visit.   Explained the  diagnoses, plan, and follow up with the patient and they expressed understanding.  Patient expressed understanding of the importance of proper follow up care.   Alford Highland Dickson Kostelnik M.D. Diseases & Surgery of the Retina and Vitreous [facility] 04/13/21     Abbreviations: M myopia (nearsighted); A astigmatism; H hyperopia (farsighted); P presbyopia; Mrx spectacle prescription;  CTL contact lenses; OD right eye; OS left eye; OU both eyes  XT exotropia; ET esotropia; PEK punctate epithelial keratitis; PEE punctate epithelial erosions; DES dry eye syndrome; MGD meibomian gland dysfunction; ATs artificial tears; PFAT's preservative free artificial tears; NSC nuclear sclerotic cataract; PSC posterior subcapsular cataract; ERM epi-retinal membrane; PVD posterior vitreous detachment; RD retinal detachment; DM diabetes mellitus; DR diabetic retinopathy; NPDR non-proliferative diabetic retinopathy; PDR proliferative diabetic retinopathy; CSME clinically significant macular edema; DME diabetic macular edema; dbh dot blot hemorrhages; CWS cotton wool spot; POAG primary open angle glaucoma; C/D cup-to-disc ratio; HVF humphrey visual field; GVF goldmann visual field; OCT optical coherence tomography; IOP intraocular pressure; BRVO Branch retinal vein occlusion; CRVO central retinal vein occlusion; CRAO central retinal artery occlusion; BRAO branch retinal artery occlusion; RT retinal tear; SB scleral buckle; PPV pars plana vitrectomy; VH Vitreous hemorrhage; PRP panretinal laser photocoagulation; IVK intravitreal kenalog; VMT vitreomacular traction; MH Macular hole;  NVD neovascularization of the disc; NVE neovascularization elsewhere; AREDS age related eye disease study; ARMD age related macular degeneration; POAG primary open angle glaucoma; EBMD epithelial/anterior basement membrane dystrophy; ACIOL anterior chamber intraocular lens; IOL intraocular lens; PCIOL posterior chamber intraocular lens;  Phaco/IOL phacoemulsification with intraocular lens placement; PRK photorefractive keratectomy; LASIK laser assisted in situ keratomileusis; HTN hypertension; DM diabetes mellitus; COPD chronic obstructive pulmonary disease

## 2021-04-21 ENCOUNTER — Other Ambulatory Visit (INDEPENDENT_AMBULATORY_CARE_PROVIDER_SITE_OTHER): Payer: Self-pay | Admitting: Ophthalmology

## 2021-04-21 ENCOUNTER — Encounter (INDEPENDENT_AMBULATORY_CARE_PROVIDER_SITE_OTHER): Payer: BC Managed Care – PPO | Admitting: Ophthalmology

## 2021-04-22 ENCOUNTER — Encounter (INDEPENDENT_AMBULATORY_CARE_PROVIDER_SITE_OTHER): Payer: BC Managed Care – PPO | Admitting: Ophthalmology

## 2021-04-29 ENCOUNTER — Encounter (INDEPENDENT_AMBULATORY_CARE_PROVIDER_SITE_OTHER): Payer: Self-pay

## 2021-04-29 ENCOUNTER — Encounter (INDEPENDENT_AMBULATORY_CARE_PROVIDER_SITE_OTHER): Payer: BC Managed Care – PPO | Admitting: Ophthalmology

## 2021-05-25 ENCOUNTER — Encounter (INDEPENDENT_AMBULATORY_CARE_PROVIDER_SITE_OTHER): Payer: BC Managed Care – PPO | Admitting: Ophthalmology

## 2021-05-25 ENCOUNTER — Ambulatory Visit (INDEPENDENT_AMBULATORY_CARE_PROVIDER_SITE_OTHER): Payer: BC Managed Care – PPO | Admitting: Ophthalmology

## 2021-05-25 ENCOUNTER — Encounter (INDEPENDENT_AMBULATORY_CARE_PROVIDER_SITE_OTHER): Payer: Self-pay | Admitting: Ophthalmology

## 2021-05-25 DIAGNOSIS — H33002 Unspecified retinal detachment with retinal break, left eye: Secondary | ICD-10-CM | POA: Diagnosis not present

## 2021-05-25 DIAGNOSIS — Z8669 Personal history of other diseases of the nervous system and sense organs: Secondary | ICD-10-CM

## 2021-05-25 DIAGNOSIS — H35371 Puckering of macula, right eye: Secondary | ICD-10-CM

## 2021-05-25 NOTE — Assessment & Plan Note (Signed)
Stable OD good buckle ?

## 2021-05-25 NOTE — Progress Notes (Signed)
? ? ?05/25/2021 ? ?  ? ?CHIEF COMPLAINT ?Patient presents for  ?Chief Complaint  ?Patient presents with  ? Retina Follow Up  ? ? ? ? ?HISTORY OF PRESENT ILLNESS: ?Rodney Lozano is a 51 y.o. male who presents to the clinic today for:  ? ?HPI   ? ? Retina Follow Up   ? ?      ? Diagnosis: Other  ? Laterality: left eye  ? Onset: 6 weeks ago  ? ?  ?  ? ? Comments   ?6 weeks dilate OU, FP, OCT. Surgery OS 03/11/2021. ?Patient states vision is stable and unchanged since last visit. Denies any new floaters or FOL. ?Patient reports he is not using any prescription eye drops currently. ? ?  ?  ?Last edited by Nelva NayKronstein, Anna N on 05/25/2021  1:57 PM.  ?  ? ? ?Referring physician: ?No referring provider defined for this encounter. ? ?HISTORICAL INFORMATION:  ? ?Selected notes from the MEDICAL RECORD NUMBER ?  ? ?Lab Results  ?Component Value Date  ? HGBA1C  03/01/2007  ?  5.5 ?(NOTE)   The ADA recommends the following therapeutic goals for glycemic   control related to Hgb A1C measurement:   Goal of Therapy:   < 7.0% Hgb A1C   Action Suggested:  > 8.0% Hgb A1C   Ref:  Diabetes Care, 22, Suppl. 1, 1999  ?  ? ?CURRENT MEDICATIONS: ?Current Outpatient Medications (Ophthalmic Drugs)  ?Medication Sig  ? ciprofloxacin (CILOXAN) 0.3 % ophthalmic solution Place 1 drop into the right eye 4 (four) times daily. UNTIL FINISHED (Patient not taking: Reported on 12/03/2019)  ? DUREZOL 0.05 % EMUL   ? loteprednol (LOTEMAX) 0.5 % ophthalmic suspension Place 1 drop into the left eye 4 (four) times daily.  ? prednisoLONE acetate (PRED FORTE) 1 % ophthalmic suspension Place 1 drop into the right eye 4 (four) times daily. UNTIL FINISHED  ? PROLENSA 0.07 % SOLN   ? timolol (TIMOPTIC) 0.5 % ophthalmic solution INSTILL 1 DROP INTO LEFT EYE EVERY DAY  ? ?No current facility-administered medications for this visit. (Ophthalmic Drugs)  ? ?Current Outpatient Medications (Other)  ?Medication Sig  ? hydrochlorothiazide (HYDRODIURIL) 25 MG tablet Take 25  mg by mouth daily.  ? ibuprofen (ADVIL) 200 MG tablet Take 400 mg by mouth every 6 (six) hours as needed for mild pain.  ? pantoprazole (PROTONIX) 40 MG tablet Take 40 mg by mouth daily before breakfast.   ? rosuvastatin (CRESTOR) 10 MG tablet Take 10 mg by mouth daily.   ? UNKNOWN TO PATIENT Take 1 capsule by mouth See admin instructions. Eye health supplement otc capsule (name unknown to patient): Take 1 capsule by mouth in the morning with breakfast  ? ?No current facility-administered medications for this visit. (Other)  ? ? ? ? ?REVIEW OF SYSTEMS: ?ROS   ?Negative for: Constitutional, Gastrointestinal, Neurological, Skin, Genitourinary, Musculoskeletal, HENT, Endocrine, Cardiovascular, Eyes, Respiratory, Psychiatric, Allergic/Imm, Heme/Lymph ?Last edited by Edmon Crapeankin, Louisa Favaro A, MD on 05/25/2021  2:48 PM.  ?  ? ? ? ?ALLERGIES ?No Known Allergies ? ?PAST MEDICAL HISTORY ?Past Medical History:  ?Diagnosis Date  ? GERD (gastroesophageal reflux disease)   ? Hypertension   ? Nuclear sclerotic cataract of left eye 11/21/2019  ? Nuclear sclerotic cataract of right eye 11/21/2019  ? 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 Resolved with vitrectomy repair retinal detachment 03-11-2021  ? ?Past Surgical History:  ?  Procedure Laterality Date  ? EYE SURGERY    ? PARS PLANA VITRECTOMY Right 09/06/2018  ? Procedure: PARS PLANA VITRECTOMY WITH 25 GAUGE with injection of antibiotics - Endophthalmitis protocol;  Surgeon: Edmon Crape, MD;  Location: [facility] OR;  Service: Ophthalmology;  Laterality: Right;  ? ? ?FAMILY HISTORY ?History reviewed. No pertinent family history. ? ?SOCIAL HISTORY ?Social History  ? ?Tobacco Use  ? Smoking status: Never  ? Smokeless tobacco: Never  ?Vaping Use  ? Vaping Use: Never used  ?Substance Use Topics  ? Alcohol use: Never  ? Drug use: Never  ? ?  ? ?  ? ?OPHTHALMIC EXAM: ? ?Base Eye Exam   ? ? Visual Acuity (ETDRS)   ? ?   Right  Left  ? Dist cc 20/30 -2 20/20 -1  ? ? Correction: Glasses  ? ?  ?  ? ? Tonometry (Tonopen, 1:59 PM)   ? ?   Right Left  ? Pressure 10 10  ? ?  ?  ? ? Pupils   ? ?   Dark Light Shape React APD  ? Right   Irregular  None  ? Left 7 7 Round Minimal None  ? ?  ?  ? ? Visual Fields (Counting fingers)   ? ?   Left Right  ?  Full Full  ? ?  ?  ? ? Extraocular Movement   ? ?   Right Left  ?  Full Full  ? ?  ?  ? ? Neuro/Psych   ? ? Oriented x3: Yes  ? Mood/Affect: Normal  ? ?  ?  ? ? Dilation   ? ? Both eyes: 1.0% Mydriacyl, 2.5% Phenylephrine @ 1:59 PM  ? ?  ?  ? ?  ? ?Slit Lamp and Fundus Exam   ? ? External Exam   ? ?   Right Left  ? External Normal Normal  ? ?  ?  ? ? Slit Lamp Exam   ? ?   Right Left  ? Lids/Lashes Normal Normal  ? Conjunctiva/Sclera White and quiet White and quiet  ? Cornea Clear Clear  ? Anterior Chamber Deep and quiet Deep and quiet  ? Iris Round and reactive Mid dilated  ? Lens Posterior chamber intraocular lens, Centered posterior chamber intraocular lens Centered posterior chamber intraocular lens  ? Anterior Vitreous Normal Normal  ? ?  ?  ? ? Fundus Exam   ? ?   Right Left  ? Posterior Vitreous Clear vitrectomized Clear and avitric  ? Disc Thin rim Normal  ? C/D Ratio .7 0.3  ? Macula Normal, no topographic distortion Normal, no topographic distortion  ? Vessels Normal Normal  ? Periphery Buckle, retina attached, good chorioretinal scarring but no new breaks Chorioretinal scarring laser retinopexy, temporally around breaks as well as around retinotomy.  No subretinal fluid remains, no new breaks, looks great  ? ?  ?  ? ?  ? ? ?IMAGING AND PROCEDURES  ?Imaging and Procedures for 05/25/21 ? ?OCT, Retina - OU - Both Eyes   ? ?   ?Right Eye ?Quality was good. Scan locations included subfoveal. Central Foveal Thickness: 341. Progression has been stable. Findings include abnormal foveal contour.  ? ?Left Eye ?Quality was good. Scan locations included subfoveal. Central Foveal Thickness: 336.  Progression has been stable. Findings include normal foveal contour.  ? ?Notes ?OD, minor retinal thickening nasal to the fovea yet with no active maculopathy.  Vastly improved macular findings  OD overall as compared to severe epiretinal membrane which was present post Retinal detachment repair prior to surgical intervention July 2020 ? ? ? ?OS, normal macula, no impact on his current blurred vision ? ?  ? ?Color Fundus Photography Optos - OU - Both Eyes   ? ?   ?Right Eye ?Progression has been stable. Macula : normal observations.  ? ?Left Eye ?Progression has been stable. Disc findings include normal observations. Macula : normal observations. Vessels : normal observations.  ? ?Notes ?Clear media OD, scleral buckle. ? ?OS clear media, no gas remaining, retina attached, good retinopexy temporal and inferotemporally OS, no fluid, retina attached nicely clear media ? ?  ? ? ?  ?  ? ?  ?ASSESSMENT/PLAN: ? 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 ?Right epiretinal membrane ?Resolved condition, minor thickening remains ? ?History of retinal detachment ?Stable OD good buckle  ? ?  ICD-10-CM   ?1. 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  Color Fundus Photography Optos - OU - Both Eyes  ?  ?2. Right epiretinal membrane  H35.371   ?  ?3. History of retinal detachment  Z86.69   ?  ? ? ?1.  OS looks great 2.5 months post vitrectomy endolaser repair inferotemporal rhegmatogenous retinal detachment ? ?2.  OU stable over time observe ? ?3. ? ?Ophthalmic Meds Ordered this visit:  ?No orders of the defined types were placed in this encounter. ? ? ?  ? ?Return in about 3 months (around 08/24/2021) for DILATE OU, COLOR FP, OCT. ? ?There are no Patient Instructions on file for this visit. ? ? ?Explained the diagnoses, plan, and follow up with the patient  and they expressed understanding.  Patient expressed understanding of the importance of proper follow up care.  ? ?Alford Highland. Aisley Whan M.D. ?Diseases & Surgery of the Retina and Vitreous ?[facility]

## 2021-05-25 NOTE — Assessment & Plan Note (Signed)
OS looks great now some 2.5 months after retinal detachment repair via vitrectomy endolaser gas injection ?

## 2021-05-25 NOTE — Assessment & Plan Note (Signed)
Resolved condition, minor thickening remains ?

## 2021-08-24 ENCOUNTER — Encounter (INDEPENDENT_AMBULATORY_CARE_PROVIDER_SITE_OTHER): Payer: BC Managed Care – PPO | Admitting: Ophthalmology

## 2021-08-31 ENCOUNTER — Ambulatory Visit (INDEPENDENT_AMBULATORY_CARE_PROVIDER_SITE_OTHER): Payer: BC Managed Care – PPO | Admitting: Ophthalmology

## 2021-08-31 ENCOUNTER — Encounter (INDEPENDENT_AMBULATORY_CARE_PROVIDER_SITE_OTHER): Payer: BC Managed Care – PPO | Admitting: Ophthalmology

## 2021-08-31 ENCOUNTER — Encounter (INDEPENDENT_AMBULATORY_CARE_PROVIDER_SITE_OTHER): Payer: Self-pay | Admitting: Ophthalmology

## 2021-08-31 DIAGNOSIS — H33002 Unspecified retinal detachment with retinal break, left eye: Secondary | ICD-10-CM | POA: Diagnosis not present

## 2021-08-31 DIAGNOSIS — H35371 Puckering of macula, right eye: Secondary | ICD-10-CM

## 2021-08-31 NOTE — Assessment & Plan Note (Signed)
The nature of macular pucker (epiretinal membrane ERM) was discussed with the patient as well as threshold criteria for vitrectomy surgery. I explained that in rare cases another surgery is needed to actually remove a second wrinkle should it regrow.  Most often, the epiretinal membrane and underlying wrinkled internal limiting membrane are removed with the first surgery, to accomplish the goals.   If the operative eye is Phakic (natural lens still present), cataract surgery is often recommended prior to Vitrectomy. This will enable the retina surgeon to have the best view during surgery and the patient to obtain optimal results in the future. Treatment options were discussed.  I have recommended at home monitoring the near vision task in a monocular (1 eye at a time), with or without near vision glasses, to look for changes or declines in reading.  OD minor, nasal aspect of fovea.  No impact on acuity continue to observe.  Acuity actually improving per patient subjectively thus observe

## 2021-08-31 NOTE — Progress Notes (Signed)
08/31/2021     CHIEF COMPLAINT Patient presents for  Chief Complaint  Patient presents with   Retina Evaluation      HISTORY OF PRESENT ILLNESS: Rodney Lozano is a 51 y.o. male who presents to the clinic today for:   HPI     Retina Evaluation           Laterality: both eyes         Comments   3 mths dilate OU COLOR FP OCT  Pt states his vision has been stable Pt denies any new floaters or FOL   History of bilateral retinal detachment OD years previous and OS repaired February 2023.  Via vitrectomy endolaser gas injection.      Last edited by Edmon Crape, MD on 08/31/2021  3:09 PM.      Referring physician: No referring provider defined for this encounter.  HISTORICAL INFORMATION:   Selected notes from the MEDICAL RECORD NUMBER    Lab Results  Component Value Date   HGBA1C  03/01/2007    5.5 (NOTE)   The ADA recommends the following therapeutic goals for glycemic   control related to Hgb A1C measurement:   Goal of Therapy:   < 7.0% Hgb A1C   Action Suggested:  > 8.0% Hgb A1C   Ref:  Diabetes Care, 22, Suppl. 1, 1999     CURRENT MEDICATIONS: Current Outpatient Medications (Ophthalmic Drugs)  Medication Sig   ciprofloxacin (CILOXAN) 0.3 % ophthalmic solution Place 1 drop into the right eye 4 (four) times daily. UNTIL FINISHED (Patient not taking: Reported on 12/03/2019)   DUREZOL 0.05 % EMUL    loteprednol (LOTEMAX) 0.5 % ophthalmic suspension Place 1 drop into the left eye 4 (four) times daily.   prednisoLONE acetate (PRED FORTE) 1 % ophthalmic suspension Place 1 drop into the right eye 4 (four) times daily. UNTIL FINISHED   PROLENSA 0.07 % SOLN    timolol (TIMOPTIC) 0.5 % ophthalmic solution INSTILL 1 DROP INTO LEFT EYE EVERY DAY   No current facility-administered medications for this visit. (Ophthalmic Drugs)   Current Outpatient Medications (Other)  Medication Sig   hydrochlorothiazide (HYDRODIURIL) 25 MG tablet Take 25 mg by mouth  daily.   ibuprofen (ADVIL) 200 MG tablet Take 400 mg by mouth every 6 (six) hours as needed for mild pain.   pantoprazole (PROTONIX) 40 MG tablet Take 40 mg by mouth daily before breakfast.    rosuvastatin (CRESTOR) 10 MG tablet Take 10 mg by mouth daily.    UNKNOWN TO PATIENT Take 1 capsule by mouth See admin instructions. Eye health supplement otc capsule (name unknown to patient): Take 1 capsule by mouth in the morning with breakfast   No current facility-administered medications for this visit. (Other)      REVIEW OF SYSTEMS: ROS   Negative for: Constitutional, Gastrointestinal, Neurological, Skin, Genitourinary, Musculoskeletal, HENT, Endocrine, Cardiovascular, Eyes, Respiratory, Psychiatric, Allergic/Imm, Heme/Lymph Last edited by Edmon Crape, MD on 08/31/2021  3:09 PM.       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 No pertinent family history.  SOCIAL HISTORY Social History   Tobacco Use   Smoking status: Never   Smokeless tobacco: Never  Vaping Use   Vaping Use: Never used  Substance Use Topics   Alcohol use: Never   Drug use: Never         OPHTHALMIC EXAM:  Base Eye Exam     Visual Acuity (ETDRS)       Right Left   Dist  cc 20/25 20/15         Tonometry (Tonopen, 2:44 PM)       Right Left   Pressure 8 13         Pupils       Pupils APD   Right PERRL None   Left PERRL          Visual Fields       Left Right    Full Full         Extraocular Movement       Right Left    Full, Ortho Full, Ortho         Neuro/Psych     Oriented x3: Yes   Mood/Affect: Normal         Dilation     Both eyes: 1.0% Mydriacyl, 2.5% Phenylephrine @ 2:41 PM           Slit Lamp and Fundus Exam     External Exam       Right Left   External Normal Normal         Slit Lamp Exam       Right Left   Lids/Lashes Normal Normal   Conjunctiva/Sclera White and quiet White and quiet   Cornea Clear Clear   Anterior Chamber Deep and quiet Deep and quiet   Iris Round and reactive Mid dilated   Lens Centered posterior chamber intraocular lens, Open posterior capsule Centered posterior chamber intraocular lens, clear capsule   Anterior Vitreous Normal Normal         Fundus Exam       Right Left   Posterior Vitreous Clear vitrectomized Clear and avitric   Disc Thin rim Normal   C/D Ratio .7 0.3   Macula Normal, no topographic distortion Normal, no topographic distortion   Vessels Normal Normal   Periphery Buckle, retina attached, good chorioretinal scarring but no new breaks Chorioretinal scarring laser retinopexy, temporally around breaks as well as around retinotomy.  No subretinal fluid remains, no new breaks, looks great            IMAGING AND PROCEDURES  Imaging and Procedures for 08/31/21  OCT, Retina - OU - Both Eyes       Right Eye Quality was good. Scan locations included subfoveal. Central Foveal Thickness: 350. Progression has been stable. Findings include abnormal foveal contour.   Left Eye Quality was good. Scan locations included subfoveal. Central Foveal Thickness: 335. Progression has been stable. Findings include normal foveal contour.   Notes OD, minor  retinal thickening nasal to the fovea yet with no active maculopathy.  Vastly improved macular findings OD overall as compared to severe epiretinal membrane which was present post Retinal detachment repair prior to surgical intervention July  2020    OS, normal macula, no impact on his current blurred vision     Color Fundus Photography Optos - OU - Both Eyes       Right Eye Progression has been stable. Macula : normal observations.   Left Eye Progression has been stable. Disc findings include normal observations. Macula : normal observations. Vessels : normal observations.   Notes Clear media OD, scleral buckle.  OS clear media, no gas remaining, retina attached, good retinopexy temporal and inferotemporally OS, no fluid, retina attached nicely clear media             ASSESSMENT/PLAN:  Right epiretinal membrane The nature of macular pucker (epiretinal membrane ERM) was discussed with the patient as well as threshold criteria for vitrectomy surgery. I explained that in rare cases another surgery is needed to actually remove a second wrinkle should it regrow.  Most often, the epiretinal membrane and underlying wrinkled internal limiting membrane are removed with the first surgery, to accomplish the goals.   If the operative eye is Phakic (natural lens still present), cataract surgery is often recommended prior to Vitrectomy. This will enable the retina surgeon to have the best view during surgery and the patient to obtain optimal results in the future. Treatment options were discussed.  I have recommended at home monitoring the near vision task in a monocular (1 eye at a time), with or without near vision glasses, to look for changes or declines in reading.  OD minor, nasal aspect of fovea.  No impact on acuity continue to observe.  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  Acuity continues to improve.  No n complications.  Retina reattached      ICD-10-CM   1. Right epiretinal membrane  H35.371 OCT, Retina - OU - Both Eyes    Color Fundus Photography Optos - OU - Both Eyes    2. 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  OS looks great 5 months post macula off detachment repair.  2.  Minor epiretinal membrane OD observe  3.  Ophthalmic Meds Ordered this visit:  No orders of the defined types were placed in this encounter.      Return in about 1 year (around 09/01/2022) for DILATE OU, COLOR FP, OCT.  There are no Patient Instructions on file for this visit.   Explained the diagnoses, plan, and follow up with the patient and they expressed understanding.  Patient expressed understanding of the importance of proper follow up care.   Alford Highland Kaina Orengo M.D. Diseases & Surgery of the Retina and Vitreous [facility] 08/31/21     Abbreviations: M myopia (nearsighted); A astigmatism; H hyperopia (farsighted); P presbyopia; Mrx spectacle prescription;  CTL contact lenses; OD right eye; OS left eye; OU both eyes  XT exotropia; ET esotropia; PEK punctate epithelial keratitis; PEE punctate epithelial erosions; DES dry eye syndrome; MGD meibomian gland dysfunction; ATs artificial tears; PFAT's preservative free artificial tears; NSC nuclear sclerotic cataract; PSC posterior subcapsular cataract; ERM epi-retinal membrane; PVD posterior vitreous detachment; RD retinal detachment; DM diabetes mellitus; DR diabetic retinopathy; NPDR non-proliferative diabetic retinopathy; PDR proliferative diabetic retinopathy; CSME clinically significant macular edema; DME diabetic macular edema; dbh dot blot hemorrhages; CWS cotton wool spot; POAG primary open angle glaucoma; C/D cup-to-disc ratio; HVF humphrey  visual field; GVF goldmann visual field; OCT  optical coherence tomography; IOP intraocular pressure; BRVO Branch retinal vein occlusion; CRVO central retinal vein occlusion; CRAO central retinal artery occlusion; BRAO branch retinal artery occlusion; RT retinal tear; SB scleral buckle; PPV pars plana vitrectomy; VH Vitreous hemorrhage; PRP panretinal laser photocoagulation; IVK intravitreal kenalog; VMT vitreomacular traction; MH Macular hole;  NVD neovascularization of the disc; NVE neovascularization elsewhere; AREDS age related eye disease study; ARMD age related macular degeneration; POAG primary open angle glaucoma; EBMD epithelial/anterior basement membrane dystrophy; ACIOL anterior chamber intraocular lens; IOL intraocular lens; PCIOL posterior chamber intraocular lens; Phaco/IOL phacoemulsification with intraocular lens placement; PRK photorefractive keratectomy; LASIK laser assisted in situ keratomileusis; HTN hypertension; DM diabetes mellitus; COPD chronic obstructive pulmonary disease

## 2021-08-31 NOTE — Assessment & Plan Note (Addendum)
Left eye looks great post vitrectomy repair macula off rhegmatogenous retinal detachment February 2023.  Acuity continues to improve.  No n complications.  Retina reattached

## 2022-09-02 ENCOUNTER — Encounter (INDEPENDENT_AMBULATORY_CARE_PROVIDER_SITE_OTHER): Payer: BC Managed Care – PPO | Admitting: Ophthalmology
